# Patient Record
Sex: Male | Born: 1970
Health system: Southern US, Community
[De-identification: ages and names within clinical notes are randomized; demographics above are authoritative.]

## PROBLEM LIST (undated history)

## (undated) DIAGNOSIS — E663 Overweight: Secondary | ICD-10-CM

## (undated) DIAGNOSIS — R5382 Chronic fatigue, unspecified: Secondary | ICD-10-CM

## (undated) DIAGNOSIS — Z8669 Personal history of other diseases of the nervous system and sense organs: Secondary | ICD-10-CM

## (undated) DIAGNOSIS — Z87442 Personal history of urinary calculi: Secondary | ICD-10-CM

## (undated) DIAGNOSIS — M51369 Other intervertebral disc degeneration, lumbar region without mention of lumbar back pain or lower extremity pain: Secondary | ICD-10-CM

## (undated) DIAGNOSIS — Z8619 Personal history of other infectious and parasitic diseases: Secondary | ICD-10-CM

## (undated) DIAGNOSIS — Z8249 Family history of ischemic heart disease and other diseases of the circulatory system: Secondary | ICD-10-CM

## (undated) DIAGNOSIS — G8929 Other chronic pain: Secondary | ICD-10-CM

## (undated) DIAGNOSIS — E291 Testicular hypofunction: Secondary | ICD-10-CM

## (undated) DIAGNOSIS — I10 Essential (primary) hypertension: Secondary | ICD-10-CM

## (undated) DIAGNOSIS — E785 Hyperlipidemia, unspecified: Secondary | ICD-10-CM

## (undated) DIAGNOSIS — M5136 Other intervertebral disc degeneration, lumbar region: Secondary | ICD-10-CM

## (undated) HISTORY — DX: Overweight: E66.3

## (undated) HISTORY — DX: Personal history of other diseases of the nervous system and sense organs: Z86.69

## (undated) HISTORY — DX: Personal history of urinary calculi: Z87.442

## (undated) HISTORY — DX: Other intervertebral disc degeneration, lumbar region without mention of lumbar back pain or lower extremity pain: M51.369

## (undated) HISTORY — DX: Family history of ischemic heart disease and other diseases of the circulatory system: Z82.49

## (undated) HISTORY — DX: Essential (primary) hypertension: I10

## (undated) HISTORY — DX: Chronic fatigue, unspecified: R53.82

## (undated) HISTORY — DX: Other chronic pain: G89.29

## (undated) HISTORY — DX: Personal history of other infectious and parasitic diseases: Z86.19

## (undated) HISTORY — DX: Hyperlipidemia, unspecified: E78.5

## (undated) HISTORY — DX: Other intervertebral disc degeneration, lumbar region: M51.36

## (undated) HISTORY — DX: Testicular hypofunction: E29.1

---

## 1988-02-07 HISTORY — PX: ELBOW SURGERY: SHX618

## 1991-02-07 HISTORY — PX: SHOULDER SURGERY: SHX246

## 2001-07-18 ENCOUNTER — Ambulatory Visit (HOSPITAL_BASED_OUTPATIENT_CLINIC_OR_DEPARTMENT_OTHER): Admission: RE | Admit: 2001-07-18 | Discharge: 2001-07-18 | Payer: Self-pay | Admitting: Orthopedic Surgery

## 2002-02-06 HISTORY — PX: ELBOW SURGERY: SHX618

## 2002-11-26 ENCOUNTER — Ambulatory Visit (HOSPITAL_BASED_OUTPATIENT_CLINIC_OR_DEPARTMENT_OTHER): Admission: RE | Admit: 2002-11-26 | Discharge: 2002-11-26 | Payer: Self-pay | Admitting: Orthopedic Surgery

## 2006-01-24 ENCOUNTER — Ambulatory Visit (HOSPITAL_COMMUNITY): Admission: RE | Admit: 2006-01-24 | Discharge: 2006-01-24 | Payer: Self-pay | Admitting: Anesthesiology

## 2008-04-03 IMAGING — CR DG LUMBAR SPINE COMPLETE 4+V
6 series · 6 of 6 positions shown · non-contrast
Comparison: none

CLINICAL DATA: Diffuse spinal pain. No injury
 CERVICAL SPINE - 5 VIEW:

[t l-spine a.p.]
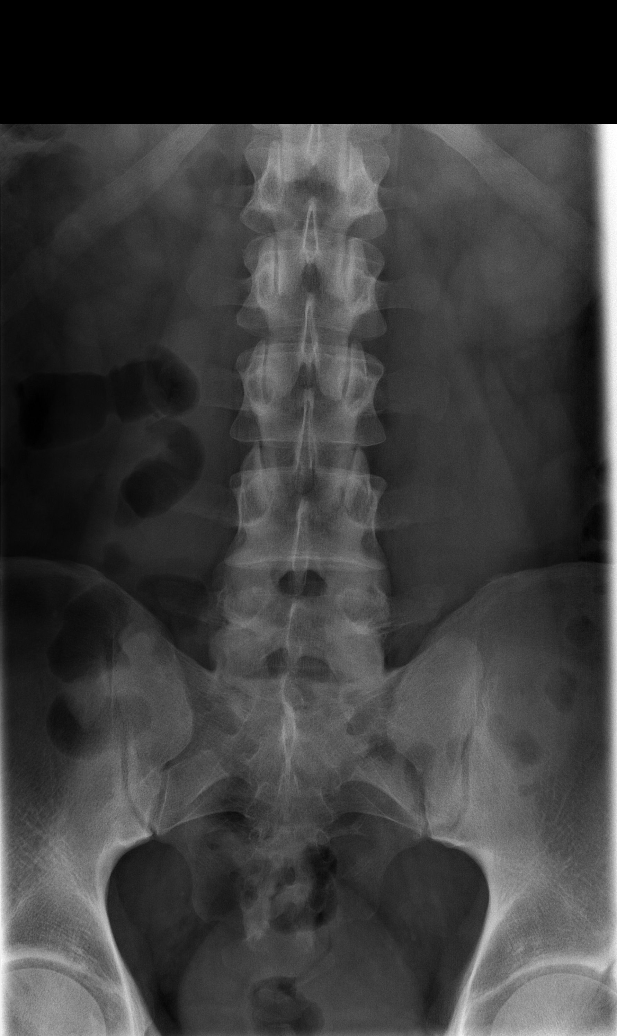

[t l-spine oblique exposure (1 of 3)]
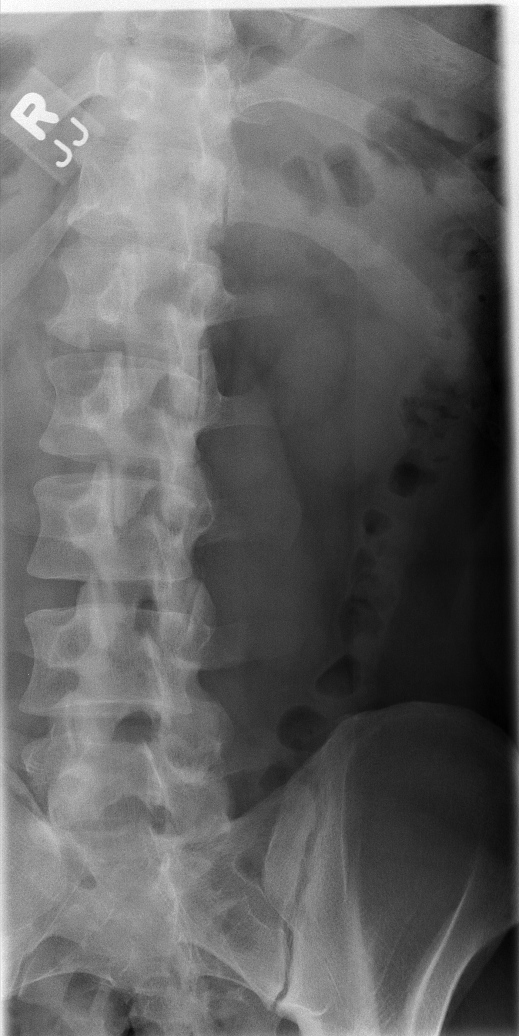

[t l-spine oblique exposure (2 of 3)]
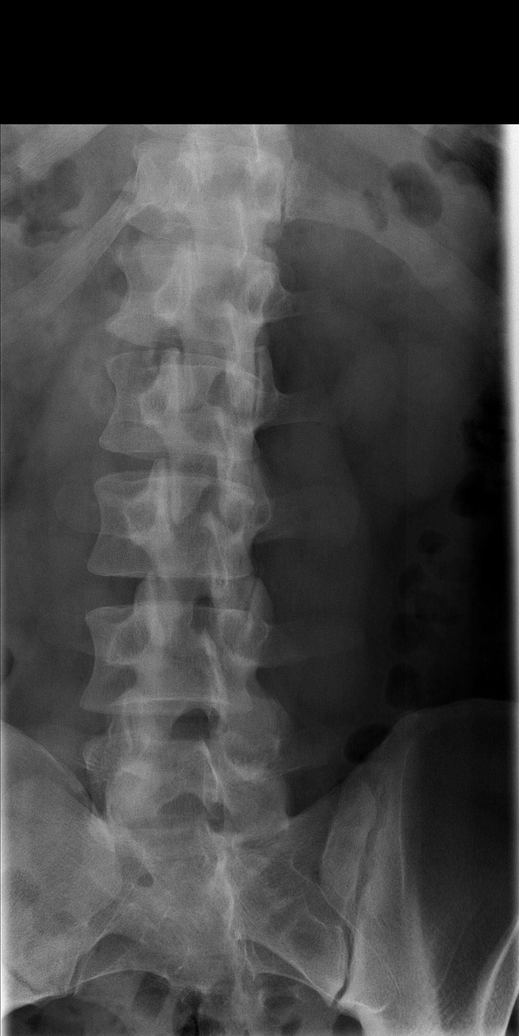

[t l-spine oblique exposure (3 of 3)]
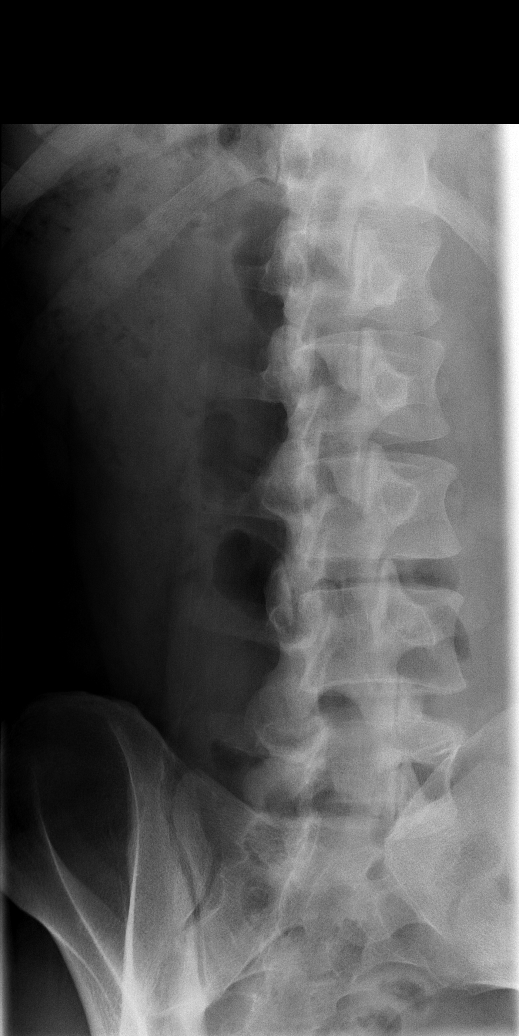

[t l-spine lat]
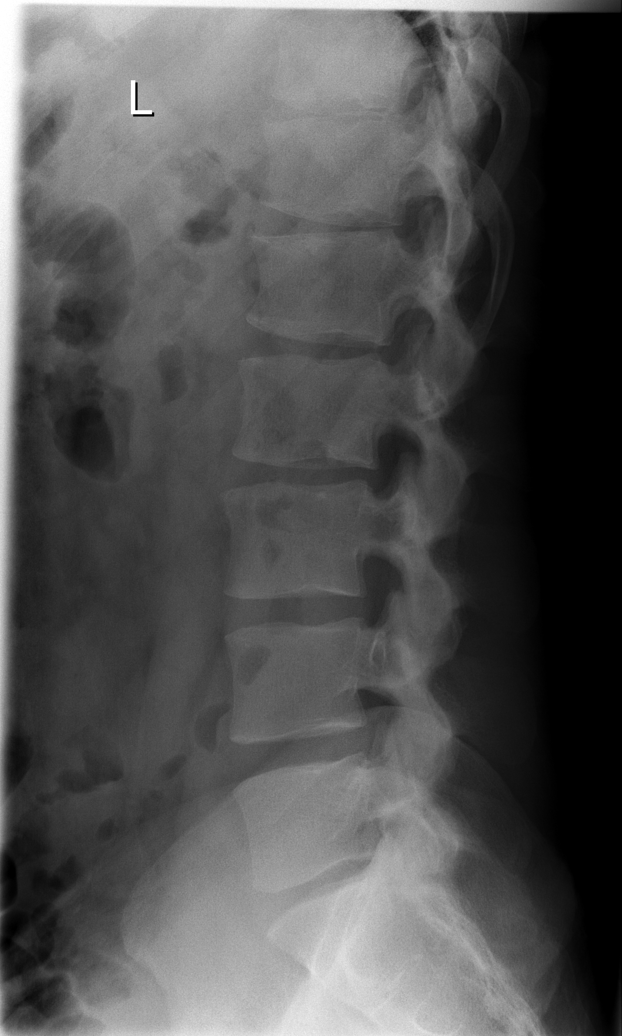

[t l-spine l5-s1 spot]
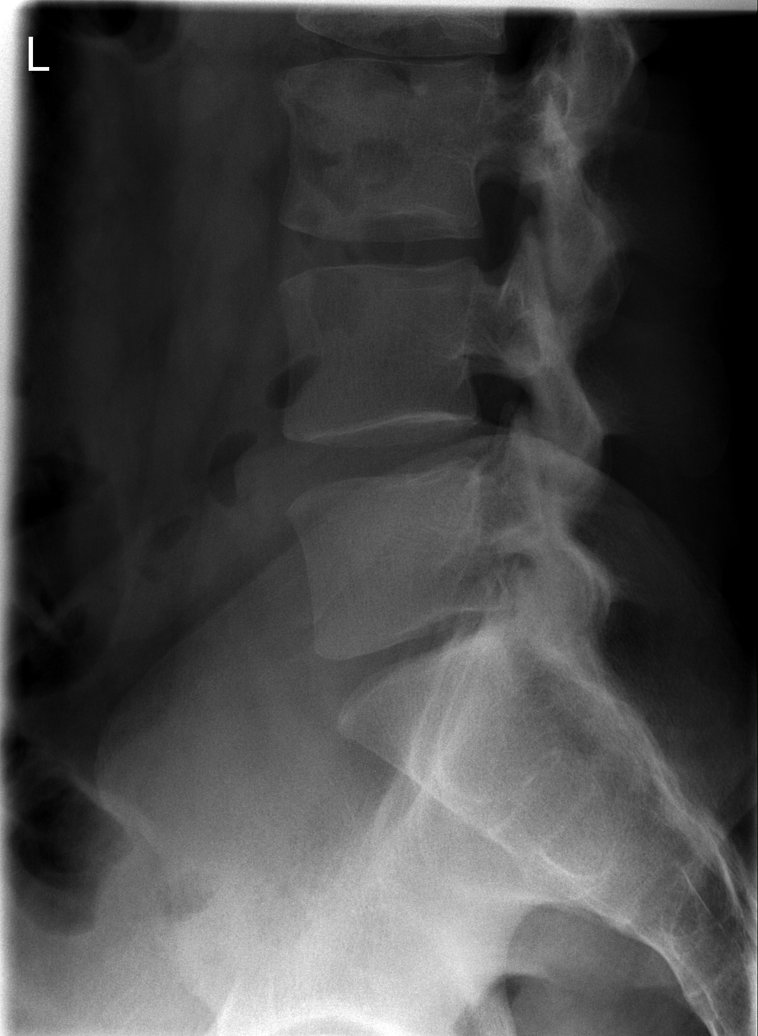

[6 of 6 positions shown; findings below may reference images not displayed]

FINDINGS: There is no evidence of cervical spine fracture or prevertebral soft tissue swelling.  Alignment is normal.  No other significant bone abnormalities are identified.
IMPRESSION: Negative cervical spine radiographs.
 THORACIC SPINE - 3 VIEW:
FINDINGS: There is no evidence of thoracic spine fracture.  Alignment is normal.  No other significant bone abnormalities are identified.
IMPRESSION: Negative thoracic spine radiographs.
 LUMBAR SPINE SERIES ? 5 VIEW:
FINDINGS: There are five lumbar type vertebrae present.  There is questionable bony spinal stenosis noted within the mid to lower lumbar spine. There are no fractures, subluxations, or destructive changes.  The interspaces are adequately maintained.
IMPRESSION: Possible bony spinal stenosis. Otherwise, negative study.

## 2011-08-03 ENCOUNTER — Encounter: Payer: Self-pay | Admitting: Family Medicine

## 2011-08-03 ENCOUNTER — Ambulatory Visit (INDEPENDENT_AMBULATORY_CARE_PROVIDER_SITE_OTHER): Payer: Federal, State, Local not specified - PPO | Admitting: Family Medicine

## 2011-08-03 VITALS — BP 154/110 | HR 95 | Temp 98.4°F | Ht 73.0 in | Wt 229.5 lb

## 2011-08-03 DIAGNOSIS — E663 Overweight: Secondary | ICD-10-CM

## 2011-08-03 DIAGNOSIS — E669 Obesity, unspecified: Secondary | ICD-10-CM | POA: Insufficient documentation

## 2011-08-03 DIAGNOSIS — M5136 Other intervertebral disc degeneration, lumbar region: Secondary | ICD-10-CM | POA: Insufficient documentation

## 2011-08-03 DIAGNOSIS — E66811 Obesity, class 1: Secondary | ICD-10-CM | POA: Insufficient documentation

## 2011-08-03 DIAGNOSIS — G894 Chronic pain syndrome: Secondary | ICD-10-CM | POA: Insufficient documentation

## 2011-08-03 DIAGNOSIS — I10 Essential (primary) hypertension: Secondary | ICD-10-CM

## 2011-08-03 DIAGNOSIS — G8929 Other chronic pain: Secondary | ICD-10-CM

## 2011-08-03 LAB — LIPID PANEL: Triglycerides: 240 mg/dL — ABNORMAL HIGH (ref 0.0–149.0)

## 2011-08-03 LAB — COMPREHENSIVE METABOLIC PANEL
ALT: 35 U/L (ref 0–53)
Albumin: 4.3 g/dL (ref 3.5–5.2)
Alkaline Phosphatase: 63 U/L (ref 39–117)
BUN: 11 mg/dL (ref 6–23)
Calcium: 9.3 mg/dL (ref 8.4–10.5)
Creatinine, Ser: 1 mg/dL (ref 0.4–1.5)
Glucose, Bld: 101 mg/dL — ABNORMAL HIGH (ref 70–99)
Sodium: 140 mEq/L (ref 135–145)
Total Protein: 7.5 g/dL (ref 6.0–8.3)

## 2011-08-03 MED ORDER — HYDROCHLOROTHIAZIDE 12.5 MG PO CAPS: 12.5000 mg | ORAL_CAPSULE | Freq: Every day | ORAL | Status: DC

## 2011-08-03 MED ORDER — LISINOPRIL-HYDROCHLOROTHIAZIDE 10-12.5 MG PO TABS: 1.0000 | ORAL_TABLET | Freq: Every day | ORAL | Status: DC

## 2011-08-03 NOTE — Assessment & Plan Note (Addendum)
Hypertensive as evidenced by recent readings at last few doctor visits (here and pain clinic) Discussed dietary changes to affect blood pressure lowering. Encouraged increased exercise. EKG today as well as blood work. EKG - NSR rate 75, normal axis, intervals, no ST/T changes. Start lisinopril HCTZ 10/12.5mg  daily.  Discussed side effects and allergic rxn to monitor for. rtc 1 mo for f/u.

## 2011-08-03 NOTE — Progress Notes (Signed)
Subjective:    Patient ID: William Friedman, male    DOB: 23-Mar-1970, 41 y.o.   MRN: [id number]  HPI CC: new pt, establish  Prior lived [place], [place] for 6 yrs.  Recently moved back to states with wife.  HTN - elevated in past, esp last few readings at pain management.  fmhx HTN.  No HA, vision changes, CP/tightness, SOB, leg swelling.  Poor diet.  High salt diet.  Poor water intake.  15-20lb weight gain since moving to the states.  chronic pain with lumbar DDD - on narcotics by pain clinic.  Preventative: Last CPE 2-3 yrs ago.  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  Planning on restarting gym Diet: poor - not regular fruits/vegetables, not regular water  Medications and allergies reviewed and updated in chart.  Past histories reviewed and updated if relevant as below. There is no problem list on file for this patient.  Past Medical History  Diagnosis Date  . History of chicken pox   . Migraine   . History of kidney stones     remote  . HTN (hypertension)   . DDD (degenerative disc disease), lumbar     chronic pain from this on chronic narcotics  . Chronic pain     sees [facility])   Past Surgical History  Procedure Date  . 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 Shoulder surgery 1993    R  . Elbow surgery 2004    R   History  Substance Use Topics  . Smoking status: Never Smoker   . Smokeless tobacco: Never Used  . Alcohol Use: Yes     rarely   Family History  Problem Relation Age of Onset  . Hypertension Mother   . Hypertension Father   . Coronary artery disease Maternal Grandfather 30  . Heart disease Maternal Grandmother   . Cancer Mother     breast  . Cancer Sister     breast  . Cancer Cousin 67  . Diabetes Neg Hx   . Stroke Neg Hx    No Known Allergies Current Outpatient Prescriptions on File Prior to  Visit  Medication Sig Dispense Refill  . zolpidem (AMBIEN) 10 MG tablet Take 10 mg by mouth at bedtime as needed.        Review of Systems  Constitutional: Negative for fever, chills, activity change, appetite change, fatigue and unexpected weight change.  HENT: Negative for hearing loss and neck pain.   Eyes: Negative for visual disturbance.  Respiratory: Negative for cough, chest tightness, shortness of breath and wheezing.   Cardiovascular: Negative for chest pain, palpitations and leg swelling.  Gastrointestinal: Negative for nausea, vomiting, abdominal pain, diarrhea, constipation, blood in stool and abdominal distention.  Genitourinary: Negative for hematuria and difficulty urinating.  Musculoskeletal: Negative for myalgias and arthralgias.  Skin: Negative for rash.  Neurological: Negative for dizziness, seizures, syncope and headaches.  Hematological: Does not bruise/bleed easily.  Psychiatric/Behavioral: Negative for dysphoric mood. The patient is not nervous/anxious.        Objective:   Physical Exam  Nursing note and vitals reviewed. Constitutional: He is oriented to person, place, and time. He appears well-developed and well-nourished. No distress.       Body mass index is 30.28 kg/(m^2).   HENT:  Head: Normocephalic and atraumatic.  Right Ear: Hearing, tympanic membrane,  external ear and ear canal normal.  Left Ear: Hearing, tympanic membrane, external ear and ear canal normal.  Nose: Nose normal. No mucosal edema or rhinorrhea.  Mouth/Throat: Oropharynx is clear and moist. No oropharyngeal exudate.  Eyes: Conjunctivae and EOM are normal. Pupils are equal, round, and reactive to light. No scleral icterus.  Neck: Normal range of motion. Neck supple.  Cardiovascular: Normal rate, regular rhythm, normal heart sounds and intact distal pulses.   No murmur heard. Pulses:      Radial pulses are 2+ on the right side, and 2+ on the left side.  Pulmonary/Chest: Effort normal  and breath sounds normal. No respiratory distress. He has no wheezes. He has no rales.  Abdominal: Soft. Bowel sounds are normal. He exhibits no distension and no mass. There is no tenderness. There is no rebound and no guarding.  Musculoskeletal: Normal range of motion. He exhibits no edema.  Lymphadenopathy:    He has no cervical adenopathy.  Neurological: He is alert and oriented to person, place, and time.       CN grossly intact, station and gait intact  Skin: Skin is warm and dry. No rash noted.  Psychiatric: He has a normal mood and affect. His behavior is normal. Judgment and thought content normal.       Assessment & Plan:

## 2011-08-03 NOTE — Patient Instructions (Addendum)
Good to meet you today.  Call us with questions. Return in 1 month for recheck blood pressure. Start lisinopril/hydrochlorothiazide 12.5mg  daily.  More fruits/vegetables for higher potassium.  Less salt/sodium (try to limit to 2000mg /day) Potassium diet handout provided today. EKG today - looking good. Blood work today.

## 2011-08-17 ENCOUNTER — Ambulatory Visit (INDEPENDENT_AMBULATORY_CARE_PROVIDER_SITE_OTHER): Payer: Federal, State, Local not specified - PPO | Admitting: Family Medicine

## 2011-08-17 ENCOUNTER — Other Ambulatory Visit (INDEPENDENT_AMBULATORY_CARE_PROVIDER_SITE_OTHER): Payer: Federal, State, Local not specified - PPO

## 2011-08-17 ENCOUNTER — Encounter: Payer: Self-pay | Admitting: Family Medicine

## 2011-08-17 VITALS — BP 148/97 | HR 98 | Temp 98.3°F | Ht 73.0 in | Wt 219.8 lb

## 2011-08-17 DIAGNOSIS — I10 Essential (primary) hypertension: Secondary | ICD-10-CM

## 2011-08-17 DIAGNOSIS — N179 Acute kidney failure, unspecified: Secondary | ICD-10-CM | POA: Insufficient documentation

## 2011-08-17 LAB — BASIC METABOLIC PANEL
BUN: 36 mg/dL — ABNORMAL HIGH (ref 6–23)
CO2: 26 mEq/L (ref 19–32)
Calcium: 10 mg/dL (ref 8.4–10.5)
Creatinine, Ser: 2.2 mg/dL — ABNORMAL HIGH (ref 0.4–1.5)
GFR: 34.22 mL/min — ABNORMAL LOW (ref >60.00)
Potassium: 3.9 mEq/L (ref 3.5–5.1)
Sodium: 135 mEq/L (ref 135–145)

## 2011-08-17 MED ORDER — AMLODIPINE BESYLATE 5 MG PO TABS: 5.0000 mg | ORAL_TABLET | Freq: Every day | ORAL | Status: DC

## 2011-08-17 MED ORDER — PROMETHAZINE HCL 25 MG PO TABS: 25.0000 mg | ORAL_TABLET | Freq: Three times a day (TID) | ORAL | Status: DC | PRN

## 2011-08-17 NOTE — Progress Notes (Signed)
  Subjective:    Patient ID: William Friedman, male    DOB: 1970/10/02, 41 y.o.   MRN: [id number]  HPI CC: not feeling well.  Saturday cut grass, got bit by yellow jackets.  Sunday went to play golf, only able to get through 6 holes and went home.  Since then, in bed.  Nauseated, deceased appetite, dizzy.  Hasn't eaten in 2 days.  Stopped BP med 3 days ago.    This in setting of recently starting lisinopril/hctz 10/12.5mg  for stage 2 HTN.  Has not been staying well hydrated.  Denies any NSAID use or other supplement use.  Review of Systems Per HPI    Objective:   Physical Exam  Nursing note and vitals reviewed. Constitutional: He appears well-developed and well-nourished. No distress.  HENT:  Head: Normocephalic and atraumatic.  Mouth/Throat: Oropharynx is clear and moist. No oropharyngeal exudate.  Eyes: Conjunctivae and EOM are normal. Pupils are equal, round, and reactive to light. No scleral icterus.  Cardiovascular: Normal rate, regular rhythm, normal heart sounds and intact distal pulses.   No murmur heard. Pulmonary/Chest: Effort normal and breath sounds normal. No respiratory distress. He has no wheezes. He has no rales.  Abdominal: Soft. Bowel sounds are normal. He exhibits no distension and no mass. There is no tenderness. There is no rebound and no guarding.       No abd/renal bruits  Musculoskeletal: He exhibits no edema.  Skin: Skin is warm and dry. No rash noted.       Assessment & Plan:

## 2011-08-17 NOTE — Assessment & Plan Note (Signed)
In setting of recent outdoor activity - likely some dehydration - as well as recently started on ACEI/HCTZ. Anticipate prerenal azotemia. Discussed in detail with patient importance to take plenty of fluid over next several days and hydrate kidneys. May recommend renal doppler to eval RAS given significant jump after ACEI. Return tomorrow for recheck Cr.  If not trending improved, low threshold to send to ER for IVF. Phenergan for nausea prn.

## 2011-08-17 NOTE — Patient Instructions (Addendum)
Your kidney function has deteriorated - likely from combination of blood pressure medicine and dehydration from working outside this week. zofran under the tongue today. Use phenergan for nausea at home - may make you sleepy Most important thing is to get plenty of fluid over the next several days. - water and gatorade. Return tomorrow for recheck kidney function. Start blood pressure medicine called amlodipine at 5mg  daily.

## 2011-08-17 NOTE — Assessment & Plan Note (Signed)
Stopped combo pill, however will likely need continued blood pressure control.  Start amlodipine 5mg  daily. Lab Results  Component Value Date   TSH 1.08 08/03/2011

## 2011-08-18 ENCOUNTER — Other Ambulatory Visit (INDEPENDENT_AMBULATORY_CARE_PROVIDER_SITE_OTHER): Payer: Federal, State, Local not specified - PPO

## 2011-08-18 ENCOUNTER — Other Ambulatory Visit: Payer: Self-pay | Admitting: Family Medicine

## 2011-08-18 DIAGNOSIS — N179 Acute kidney failure, unspecified: Secondary | ICD-10-CM

## 2011-08-18 LAB — RENAL FUNCTION PANEL
Albumin: 4.6 g/dL (ref 3.5–5.2)
BUN: 27 mg/dL — ABNORMAL HIGH (ref 6–23)
Calcium: 9.2 mg/dL (ref 8.4–10.5)

## 2011-08-24 ENCOUNTER — Other Ambulatory Visit (INDEPENDENT_AMBULATORY_CARE_PROVIDER_SITE_OTHER): Payer: Federal, State, Local not specified - PPO

## 2011-08-24 DIAGNOSIS — N179 Acute kidney failure, unspecified: Secondary | ICD-10-CM

## 2011-08-24 LAB — BASIC METABOLIC PANEL WITH GFR
BUN: 13 mg/dL (ref 6–23)
CO2: 25 meq/L (ref 19–32)
Calcium: 9.1 mg/dL (ref 8.4–10.5)
Chloride: 104 meq/L (ref 96–112)
Creatinine, Ser: 1.3 mg/dL (ref 0.4–1.5)
GFR: 62.99 mL/min
Glucose, Bld: 108 mg/dL — ABNORMAL HIGH (ref 70–99)
Potassium: 3.8 meq/L (ref 3.5–5.1)
Sodium: 139 meq/L (ref 135–145)

## 2011-09-01 ENCOUNTER — Ambulatory Visit (INDEPENDENT_AMBULATORY_CARE_PROVIDER_SITE_OTHER): Payer: Federal, State, Local not specified - PPO | Admitting: Family Medicine

## 2011-09-01 ENCOUNTER — Encounter: Payer: Self-pay | Admitting: Family Medicine

## 2011-09-01 VITALS — BP 140/90 | HR 87 | Temp 97.9°F | Ht 73.0 in | Wt 230.0 lb

## 2011-09-01 DIAGNOSIS — N179 Acute kidney failure, unspecified: Secondary | ICD-10-CM

## 2011-09-01 DIAGNOSIS — I10 Essential (primary) hypertension: Secondary | ICD-10-CM

## 2011-09-01 MED ORDER — AMLODIPINE BESYLATE 10 MG PO TABS: 10.0000 mg | ORAL_TABLET | Freq: Every day | ORAL | Status: DC

## 2011-09-01 NOTE — Progress Notes (Signed)
  Subjective:    Patient ID: William Friedman, male    DOB: December 01, 1970, 41 y.o.   MRN: [id number]  HPI CC: f/u HTN  See prior notes for details.  Last few days 145/99-100.  Compliant with amlodipine 5mg  daily.  Tolerating amlodipine well.  No leg swelling.  No HA, vision changes, CP/tightness, SOB, leg swelling.  Review of Systems Per HPI    Objective:   Physical Exam     Assessment & Plan:

## 2011-09-01 NOTE — Assessment & Plan Note (Signed)
Has resolved . Discussed importance of hydration.

## 2011-09-01 NOTE — Assessment & Plan Note (Signed)
See prior notes for details.  Improved control. Tolerating amlodipine, but likely will need higher dose for good control. BP Readings from Last 3 Encounters:  09/01/11 140/90  08/17/11 148/97  08/03/11 154/110  given ARF with ACEI, will schedule renal dopplers to eval for RAS.

## 2011-09-01 NOTE — Patient Instructions (Addendum)
Keep an eye on blood pressure.  If staying above 140/90 over the next week, increase amlodipine to 10mg  daily.  (double up on pills until new prescription). Pass by Marion's office to schedule renal dopplers to check kidney arteries. Good to see you today, call us with questions. Return in 3-4 months for follow up, prior fasting for blood work.

## 2011-09-05 ENCOUNTER — Encounter (INDEPENDENT_AMBULATORY_CARE_PROVIDER_SITE_OTHER): Payer: Federal, State, Local not specified - PPO

## 2011-09-05 DIAGNOSIS — I10 Essential (primary) hypertension: Secondary | ICD-10-CM

## 2011-09-05 DIAGNOSIS — N179 Acute kidney failure, unspecified: Secondary | ICD-10-CM

## 2011-09-13 ENCOUNTER — Ambulatory Visit: Payer: Self-pay | Admitting: Internal Medicine

## 2011-12-18 ENCOUNTER — Other Ambulatory Visit: Payer: Self-pay | Admitting: Family Medicine

## 2011-12-18 DIAGNOSIS — I10 Essential (primary) hypertension: Secondary | ICD-10-CM

## 2011-12-18 DIAGNOSIS — E785 Hyperlipidemia, unspecified: Secondary | ICD-10-CM

## 2011-12-26 ENCOUNTER — Other Ambulatory Visit (INDEPENDENT_AMBULATORY_CARE_PROVIDER_SITE_OTHER): Payer: Federal, State, Local not specified - PPO

## 2011-12-26 DIAGNOSIS — E785 Hyperlipidemia, unspecified: Secondary | ICD-10-CM

## 2011-12-26 DIAGNOSIS — I10 Essential (primary) hypertension: Secondary | ICD-10-CM

## 2011-12-26 LAB — BASIC METABOLIC PANEL
CO2: 26 mEq/L (ref 19–32)
Calcium: 9.2 mg/dL (ref 8.4–10.5)
Glucose, Bld: 129 mg/dL — ABNORMAL HIGH (ref 70–99)

## 2011-12-26 LAB — LIPID PANEL
HDL: 40.2 mg/dL (ref >39.00)
Total CHOL/HDL Ratio: 9

## 2012-01-02 ENCOUNTER — Ambulatory Visit: Payer: Federal, State, Local not specified - PPO | Admitting: Family Medicine

## 2012-01-12 ENCOUNTER — Ambulatory Visit (INDEPENDENT_AMBULATORY_CARE_PROVIDER_SITE_OTHER): Payer: Federal, State, Local not specified - PPO | Admitting: Family Medicine

## 2012-01-12 ENCOUNTER — Encounter: Payer: Self-pay | Admitting: *Deleted

## 2012-01-12 ENCOUNTER — Encounter: Payer: Self-pay | Admitting: Family Medicine

## 2012-01-12 VITALS — BP 128/90 | HR 96 | Temp 98.6°F | Ht 73.0 in | Wt 238.8 lb

## 2012-01-12 DIAGNOSIS — E663 Overweight: Secondary | ICD-10-CM

## 2012-01-12 DIAGNOSIS — R7303 Prediabetes: Secondary | ICD-10-CM | POA: Insufficient documentation

## 2012-01-12 DIAGNOSIS — R739 Hyperglycemia, unspecified: Secondary | ICD-10-CM

## 2012-01-12 DIAGNOSIS — Z23 Encounter for immunization: Secondary | ICD-10-CM

## 2012-01-12 DIAGNOSIS — E785 Hyperlipidemia, unspecified: Secondary | ICD-10-CM

## 2012-01-12 DIAGNOSIS — R7309 Other abnormal glucose: Secondary | ICD-10-CM

## 2012-01-12 DIAGNOSIS — I10 Essential (primary) hypertension: Secondary | ICD-10-CM

## 2012-01-12 LAB — HEMOGLOBIN A1C: Hgb A1c MFr Bld: 5.8 % (ref 4.6–6.5)

## 2012-01-12 MED ORDER — ATORVASTATIN CALCIUM 40 MG PO TABS: 40.0000 mg | ORAL_TABLET | Freq: Every day | ORAL | Status: DC

## 2012-01-12 MED ORDER — PROMETHAZINE HCL 25 MG PO TABS: 25.0000 mg | ORAL_TABLET | Freq: Three times a day (TID) | ORAL | Status: AC | PRN

## 2012-01-12 MED ORDER — HYDROCHLOROTHIAZIDE 12.5 MG PO TABS: 12.5000 mg | ORAL_TABLET | Freq: Every day | ORAL | Status: DC

## 2012-01-12 NOTE — Progress Notes (Signed)
  Subjective:    Patient ID: William Friedman, male    DOB: 09-07-1970, 41 y.o.   MRN: [id number]  HPI CC: f/u HTN  No questions or concerns today.  HTN - Compliant with norvasc 10mg  daily. H/o ARF with ACEI/HCTZ and dehydration. At home running 140-150/90-100.  Today better. No HA, vision changes, CP/tightness, SOB, leg swelling.  HLD - very high numbers, reviewed with patient.  Endorses poor dietary choices.  Father with CAD.  Hyperglycemia - glu fasting 129.  Father with DM.  Weight - new year's resolution to lose weight, wants to work out with coworkers.  Working more outside, walking with daughter outside.  Attributes weight gain to holiday season. Wt Readings from Last 3 Encounters:  01/12/12 238 lb 12 oz (108.296 kg)  09/01/11 230 lb (104.327 kg)  08/17/11 219 lb 12 oz (99.678 kg)  Body mass index is 31.50 kg/(m^2).   Past Medical History  Diagnosis Date  . History of chicken pox   . History of migraine   . History of kidney stones     remote  . HTN (hypertension)   . DDD (degenerative disc disease), lumbar     chronic pain from this on chronic narcotics  . Chronic pain     sees [facility])  . Overweight     Family History  Problem Relation Age of Onset  . Hypertension Mother   . Hypertension Father   . Coronary artery disease Maternal Grandfather 30  . Heart disease Maternal Grandmother   . Cancer Mother     breast  . Cancer Sister     breast  . Cancer Cousin 78  . Diabetes Father   . Stroke Father 34    ministroke  . Coronary artery disease Father 27    CABG    Review of Systems Per HPI    Objective:   Physical Exam  Nursing note and vitals reviewed. Constitutional: He appears well-developed and well-nourished. No distress.  HENT:  Head: Normocephalic and atraumatic.  Mouth/Throat: Oropharynx is clear and moist. No oropharyngeal exudate.  Eyes: Conjunctivae normal and EOM are normal. Pupils are equal, round, and  reactive to light. No scleral icterus.  Neck: Normal range of motion. Neck supple.  Cardiovascular: Normal rate, regular rhythm, normal heart sounds and intact distal pulses.   No murmur heard. Pulmonary/Chest: Effort normal and breath sounds normal. No respiratory distress. He has no wheezes. He has no rales.  Musculoskeletal: He exhibits no edema.  Lymphadenopathy:    He has no cervical adenopathy.  Skin: Skin is warm and dry. No rash noted.  Psychiatric: He has a normal mood and affect.       Assessment & Plan:

## 2012-01-12 NOTE — Assessment & Plan Note (Signed)
Chronic, not at goal.  Given endorsed high readings at home, start HCTZ 12.5mg  daily.

## 2012-01-12 NOTE — Assessment & Plan Note (Signed)
Remaining elevated - start atorvastatin. Discussed myalgias. Framingham risk = 11%.  rec start aspirin QOD.

## 2012-01-12 NOTE — Assessment & Plan Note (Signed)
Reviewed fasting cbg with patient and concern for being in diabetic range.  Check a1c today. If elevated >6.5%, start metformin and rtc 1 mo for f/u.  O/w rtc 3 mo.

## 2012-01-12 NOTE — Patient Instructions (Addendum)
Return in 3 months for follow up - if sugar is very high I may have you come back in one month instead  Start lipitor 40mg  daily (atorvastatin is generic) for cholesterol. Start hctz 12.5mg  daily for blood pressure. Checking A1c today - we will call you with results and whether we need to start diabetes medicine. Flu shot today Start enteric coated baby aspirin every other day.

## 2012-01-12 NOTE — Assessment & Plan Note (Signed)
Reviewed weight today.

## 2012-02-07 DIAGNOSIS — I10 Essential (primary) hypertension: Secondary | ICD-10-CM

## 2012-02-07 HISTORY — DX: Essential (primary) hypertension: I10

## 2012-04-12 ENCOUNTER — Ambulatory Visit: Payer: Federal, State, Local not specified - PPO | Admitting: Family Medicine

## 2012-04-19 ENCOUNTER — Ambulatory Visit (INDEPENDENT_AMBULATORY_CARE_PROVIDER_SITE_OTHER): Payer: Federal, State, Local not specified - PPO | Admitting: Family Medicine

## 2012-04-19 ENCOUNTER — Encounter: Payer: Self-pay | Admitting: Family Medicine

## 2012-04-19 VITALS — BP 126/78 | HR 72 | Temp 97.5°F

## 2012-04-19 DIAGNOSIS — R7309 Other abnormal glucose: Secondary | ICD-10-CM

## 2012-04-19 DIAGNOSIS — I10 Essential (primary) hypertension: Secondary | ICD-10-CM

## 2012-04-19 DIAGNOSIS — E291 Testicular hypofunction: Secondary | ICD-10-CM | POA: Insufficient documentation

## 2012-04-19 DIAGNOSIS — R5383 Other fatigue: Secondary | ICD-10-CM

## 2012-04-19 DIAGNOSIS — E785 Hyperlipidemia, unspecified: Secondary | ICD-10-CM

## 2012-04-19 DIAGNOSIS — E663 Overweight: Secondary | ICD-10-CM

## 2012-04-19 DIAGNOSIS — R5381 Other malaise: Secondary | ICD-10-CM

## 2012-04-19 DIAGNOSIS — R739 Hyperglycemia, unspecified: Secondary | ICD-10-CM

## 2012-04-19 MED ORDER — HYDROCHLOROTHIAZIDE 12.5 MG PO TABS: 12.5000 mg | ORAL_TABLET | Freq: Every day | ORAL | Status: DC

## 2012-04-19 MED ORDER — PROMETHAZINE HCL 25 MG PO TABS: 25.0000 mg | ORAL_TABLET | Freq: Four times a day (QID) | ORAL | Status: DC | PRN

## 2012-04-19 MED ORDER — AMLODIPINE BESYLATE 10 MG PO TABS: 10.0000 mg | ORAL_TABLET | Freq: Every day | ORAL | Status: DC

## 2012-04-19 MED ORDER — ATORVASTATIN CALCIUM 40 MG PO TABS: 40.0000 mg | ORAL_TABLET | Freq: Every day | ORAL | Status: DC

## 2012-04-19 NOTE — Assessment & Plan Note (Signed)
Chronic, stable. Continue meds. rtc 6 mo for f/u. BP Readings from Last 3 Encounters:  04/19/12 126/78  01/12/12 128/90  09/01/11 140/90

## 2012-04-19 NOTE — Assessment & Plan Note (Signed)
Will continue to monitor.

## 2012-04-19 NOTE — Assessment & Plan Note (Signed)
Encouraged to start exercising regimen.

## 2012-04-19 NOTE — Assessment & Plan Note (Signed)
Encouraged to change diet - more nuts, beans, lentils and legumes and soy.  More fruits/vegetables/fiber. Continue lipitor 40mg . Check FLP next blood work (6 mo)

## 2012-04-19 NOTE — Patient Instructions (Signed)
You're doing well. Return in 6 months fasting for blood work, afterwards for follow up visit. Work on diet and increase regular activity

## 2012-04-19 NOTE — Progress Notes (Signed)
  Subjective:    Patient ID: William Friedman, male    DOB: Dec 05, 1970, 42 y.o.   MRN: [id number]  HPI CC: 3 mo f/u  [name] returns today as 3 month follow up for hypertension, hyperlipidemia and hyperglycemia.  HTN - No HA, vision changes, CP/tightness, SOB, leg swelling. Compliant with bp meds.  Not checking at home.  States at pain clinic sbp has been elevated - 150s HLD - compliant with lipitor 40mg .  No mylagias. Obesity - has not restarted exercise regimen yet - lacks motivation. Hyperglycemia - A1c elevated at 5.8% - prediabetes. Overall stable.  Wonders about low testosterone.  Denies depressed mood.  Endorses decreased energy level.  Average sex drive.  Wt Readings from Last 3 Encounters:  01/12/12 238 lb 12 oz (108.296 kg)  09/01/11 230 lb (104.327 kg)  08/17/11 219 lb 12 oz (99.678 kg)  scale is broken.  Past Medical History  Diagnosis Date  . History of chicken pox   . History of migraine   . History of kidney stones     remote  . HTN (hypertension)   . DDD (degenerative disc disease), lumbar     chronic pain from this on chronic narcotics  . Chronic pain     sees [facility])  . Overweight   . Hyperlipidemia      Review of Systems Per HPI    Objective:   Physical Exam  Nursing note and vitals reviewed. Constitutional: He appears well-developed and well-nourished. No distress.  HENT:  Head: Normocephalic and atraumatic.  Mouth/Throat: Oropharynx is clear and moist. No oropharyngeal exudate.  Eyes: Conjunctivae and EOM are normal. Pupils are equal, round, and reactive to light. No scleral icterus.  Neck: Normal range of motion. Neck supple. Carotid bruit is not present.  Cardiovascular: Normal rate, regular rhythm, normal heart sounds and intact distal pulses.   No murmur heard. Pulmonary/Chest: Effort normal and breath sounds normal. No respiratory distress. He has no wheezes. He has no rales.  Musculoskeletal: He exhibits no  edema.       Assessment & Plan:

## 2012-04-19 NOTE — Assessment & Plan Note (Signed)
Consider checking T next blood draw.

## 2012-07-31 ENCOUNTER — Other Ambulatory Visit: Payer: Self-pay | Admitting: Family Medicine

## 2012-10-15 ENCOUNTER — Other Ambulatory Visit (INDEPENDENT_AMBULATORY_CARE_PROVIDER_SITE_OTHER): Payer: Federal, State, Local not specified - PPO

## 2012-10-15 ENCOUNTER — Other Ambulatory Visit: Payer: Self-pay | Admitting: Family Medicine

## 2012-10-15 DIAGNOSIS — R7309 Other abnormal glucose: Secondary | ICD-10-CM

## 2012-10-15 DIAGNOSIS — E785 Hyperlipidemia, unspecified: Secondary | ICD-10-CM

## 2012-10-15 DIAGNOSIS — E663 Overweight: Secondary | ICD-10-CM

## 2012-10-15 DIAGNOSIS — R7303 Prediabetes: Secondary | ICD-10-CM

## 2012-10-15 DIAGNOSIS — R5383 Other fatigue: Secondary | ICD-10-CM

## 2012-10-15 DIAGNOSIS — I10 Essential (primary) hypertension: Secondary | ICD-10-CM

## 2012-10-15 DIAGNOSIS — R5381 Other malaise: Secondary | ICD-10-CM

## 2012-10-15 LAB — CBC WITH DIFFERENTIAL/PLATELET
Eosinophils Relative: 2.1 % (ref 0.0–5.0)
MCV: 85.9 fl (ref 78.0–100.0)
Monocytes Absolute: 0.6 10*3/uL (ref 0.1–1.0)
Monocytes Relative: 8.7 % (ref 3.0–12.0)
Neutrophils Relative %: 53.4 % (ref 43.0–77.0)
Platelets: 367 10*3/uL (ref 150.0–400.0)
WBC: 7.2 10*3/uL (ref 4.5–10.5)

## 2012-10-15 LAB — LIPID PANEL: Cholesterol: 224 mg/dL — ABNORMAL HIGH (ref 0–200)

## 2012-10-15 LAB — COMPREHENSIVE METABOLIC PANEL
BUN: 13 mg/dL (ref 6–23)
CO2: 26 mEq/L (ref 19–32)
Calcium: 9.4 mg/dL (ref 8.4–10.5)
Chloride: 103 mEq/L (ref 96–112)
Creatinine, Ser: 1 mg/dL (ref 0.4–1.5)
GFR: 82.5 mL/min (ref >60.00)

## 2012-10-15 LAB — LDL CHOLESTEROL, DIRECT: Direct LDL: 128.3 mg/dL

## 2012-10-15 LAB — VITAMIN B12: Vitamin B-12: 529 pg/mL (ref 211–911)

## 2012-10-15 LAB — TESTOSTERONE: Testosterone: 178.85 ng/dL — ABNORMAL LOW (ref 350.00–890.00)

## 2012-10-15 LAB — HEMOGLOBIN A1C: Hgb A1c MFr Bld: 5.9 % (ref 4.6–6.5)

## 2012-10-22 ENCOUNTER — Ambulatory Visit (INDEPENDENT_AMBULATORY_CARE_PROVIDER_SITE_OTHER): Payer: Federal, State, Local not specified - PPO | Admitting: Family Medicine

## 2012-10-22 ENCOUNTER — Encounter: Payer: Self-pay | Admitting: Family Medicine

## 2012-10-22 VITALS — BP 118/88 | HR 88 | Temp 97.8°F | Ht 73.0 in | Wt 246.0 lb

## 2012-10-22 DIAGNOSIS — I10 Essential (primary) hypertension: Secondary | ICD-10-CM

## 2012-10-22 DIAGNOSIS — R7303 Prediabetes: Secondary | ICD-10-CM

## 2012-10-22 DIAGNOSIS — E785 Hyperlipidemia, unspecified: Secondary | ICD-10-CM

## 2012-10-22 DIAGNOSIS — Z23 Encounter for immunization: Secondary | ICD-10-CM

## 2012-10-22 DIAGNOSIS — E291 Testicular hypofunction: Secondary | ICD-10-CM

## 2012-10-22 DIAGNOSIS — R7309 Other abnormal glucose: Secondary | ICD-10-CM

## 2012-10-22 DIAGNOSIS — R7989 Other specified abnormal findings of blood chemistry: Secondary | ICD-10-CM

## 2012-10-22 DIAGNOSIS — E663 Overweight: Secondary | ICD-10-CM

## 2012-10-22 LAB — PSA: PSA: 0.56 ng/mL (ref 0.10–4.00)

## 2012-10-22 NOTE — Assessment & Plan Note (Signed)
Again reviewed #s - pt did have mc donalds meal prior to last bood work - recheck trig today. Change lipitor to 40mg  qhs.

## 2012-10-22 NOTE — Assessment & Plan Note (Signed)
Chronic, stable on current regimen - continue. 

## 2012-10-22 NOTE — Progress Notes (Signed)
  Subjective:    Patient ID: William Friedman, male    DOB: [birth year], 42 y.o.   MRN: [id number]  HPI CC: 6 mo f/u  William Friedman returns today as 3 month follow up for hypertension, hyperlipidemia and hyperglycemia.   HTN - No HA, vision changes, CP/tightness, SOB, leg swelling. Compliant with bp meds.  HLD - compliant with lipitor 40mg . No mylagias. Takes at night. Prediabetes - A1c elevated at 5.8% - prediabetes. Overall stable.  Fatigue - ongoing for several years now.  Denies depressed mood or decreased libido.  Found to have low testosterone.  Would be interested in supplementation if confirmatory testing low.  Obesity - hasn't changed diet.  Exercising some - walking, golfing.  Nothing regular.  Weight gain noted. Wt Readings from Last 3 Encounters:  10/22/12 246 lb (111.585 kg)  01/12/12 238 lb 12 oz (108.296 kg)  09/01/11 230 lb (104.327 kg)   Past Medical History  Diagnosis Date  . History of chicken pox   . History of migraine   . History of kidney stones     remote  . HTN (hypertension)   . DDD (degenerative disc disease), lumbar     chronic pain from this on chronic narcotics  . Chronic pain     sees [facility])  . Overweight(278.02)   . Hyperlipidemia    Review of Systems Per HPI    Objective:   Physical Exam  Nursing note and vitals reviewed. Constitutional: He appears well-developed and well-nourished. No distress.  HENT:  Mouth/Throat: Oropharynx is clear and moist. No oropharyngeal exudate.  Cardiovascular: Normal rate, regular rhythm, normal heart sounds and intact distal pulses.   No murmur heard. Pulmonary/Chest: Effort normal and breath sounds normal. No respiratory distress. He has no wheezes. He has no rales.  Musculoskeletal: He exhibits no edema.       Assessment & Plan:

## 2012-10-22 NOTE — Assessment & Plan Note (Signed)
Again reviewed with patient

## 2012-10-22 NOTE — Assessment & Plan Note (Signed)
Body mass index is 32.46 kg/(m^2).  Encouraged weight loss through regular exercise regimen.

## 2012-10-22 NOTE — Assessment & Plan Note (Signed)
Low T on screening blood work - recheck today confirmatory testing. Discussed pros/cons of supplementation. If confirmed low - consider topical testosterone. Check PSA in anticipation today

## 2012-10-22 NOTE — Addendum Note (Signed)
Addended by: Janalyn Harder on: 10/22/2012 10:58 AM   Modules accepted: Orders

## 2012-10-22 NOTE — Patient Instructions (Addendum)
Flu shot today. Let's change lipitor to 40mg  at bedtime.   Blood work today to recheck testosterone and triglycerides. Work on decreased sweetened beverages. Good to see you today, call us with questions.

## 2012-10-23 ENCOUNTER — Other Ambulatory Visit: Payer: Self-pay | Admitting: Family Medicine

## 2012-10-23 DIAGNOSIS — E291 Testicular hypofunction: Secondary | ICD-10-CM

## 2012-10-23 LAB — TESTOSTERONE, FREE, TOTAL, SHBG
Testosterone-% Free: 2.2 % (ref 1.6–2.9)
Testosterone: 180 ng/dL — ABNORMAL LOW (ref 300–890)

## 2012-10-23 MED ORDER — TESTOSTERONE 20.25 MG/ACT (1.62%) TD GEL: 1.0000 | Freq: Every day | TRANSDERMAL | Status: DC

## 2012-10-23 NOTE — Telephone Encounter (Signed)
Spoke with patient. He said he forgot to ask for refill yesterday, but would like to keep some on hand because when he does get nauseated, it seems to be the only thing that works.

## 2012-10-23 NOTE — Telephone Encounter (Signed)
Ok to refill 

## 2012-10-23 NOTE — Telephone Encounter (Signed)
plz check with pt to see if persistent need and why

## 2012-10-24 ENCOUNTER — Ambulatory Visit: Payer: Federal, State, Local not specified - PPO

## 2012-10-24 DIAGNOSIS — E291 Testicular hypofunction: Secondary | ICD-10-CM

## 2012-10-25 ENCOUNTER — Telehealth: Payer: Self-pay

## 2012-10-25 NOTE — Telephone Encounter (Signed)
Prior auth required for Androgel; called to get form [id number] and Herbert Seta said could do on phone now with diagnosis. Approval over phone 08/24/12-04/24/13. Rob at [facility] said rx went thru.

## 2012-10-27 ENCOUNTER — Encounter: Payer: Self-pay | Admitting: Family Medicine

## 2012-11-22 ENCOUNTER — Other Ambulatory Visit: Payer: Federal, State, Local not specified - PPO

## 2012-11-22 ENCOUNTER — Other Ambulatory Visit (INDEPENDENT_AMBULATORY_CARE_PROVIDER_SITE_OTHER): Payer: Federal, State, Local not specified - PPO

## 2012-11-22 DIAGNOSIS — E291 Testicular hypofunction: Secondary | ICD-10-CM

## 2012-11-26 ENCOUNTER — Other Ambulatory Visit: Payer: Self-pay | Admitting: Family Medicine

## 2012-11-26 DIAGNOSIS — E291 Testicular hypofunction: Secondary | ICD-10-CM

## 2012-11-26 MED ORDER — TESTOSTERONE 20.25 MG/ACT (1.62%) TD GEL: 2.0000 | Freq: Every day | TRANSDERMAL | Status: DC

## 2012-12-06 ENCOUNTER — Other Ambulatory Visit: Payer: Self-pay | Admitting: Family Medicine

## 2012-12-06 NOTE — Telephone Encounter (Signed)
Ok to refill 

## 2013-01-24 ENCOUNTER — Other Ambulatory Visit (INDEPENDENT_AMBULATORY_CARE_PROVIDER_SITE_OTHER): Payer: Federal, State, Local not specified - PPO

## 2013-01-24 DIAGNOSIS — E291 Testicular hypofunction: Secondary | ICD-10-CM

## 2013-01-24 LAB — CORTISOL: Cortisol, Plasma: 20.6 ug/dL

## 2013-01-24 LAB — T4, FREE: Free T4: 0.95 ng/dL (ref 0.60–1.60)

## 2013-01-27 LAB — TESTOSTERONE, FREE, TOTAL, SHBG
Sex Hormone Binding: 27 nmol/L (ref 13–71)
Testosterone-% Free: 2.3 % (ref 1.6–2.9)
Testosterone: 498 ng/dL (ref 300–890)

## 2013-02-11 ENCOUNTER — Other Ambulatory Visit: Payer: Self-pay | Admitting: Family Medicine

## 2013-02-11 NOTE — Telephone Encounter (Signed)
Please refill times one  

## 2013-02-11 NOTE — Telephone Encounter (Signed)
Ok to refill in Dr. Timoteo ExposeG's absence? Patient likes to keep some on hand if needed. Last filled 12/06/12.

## 2013-02-12 NOTE — Telephone Encounter (Signed)
Rx refilled as directed. 

## 2013-04-21 ENCOUNTER — Ambulatory Visit: Payer: Federal, State, Local not specified - PPO | Admitting: Family Medicine

## 2013-04-24 ENCOUNTER — Other Ambulatory Visit: Payer: Self-pay | Admitting: Family Medicine

## 2013-04-25 NOTE — Telephone Encounter (Signed)
Rout to PCP 

## 2013-05-15 ENCOUNTER — Other Ambulatory Visit: Payer: Self-pay | Admitting: Family Medicine

## 2013-07-26 ENCOUNTER — Other Ambulatory Visit: Payer: Self-pay | Admitting: Family Medicine

## 2013-10-19 ENCOUNTER — Other Ambulatory Visit: Payer: Self-pay | Admitting: Family Medicine

## 2013-11-12 ENCOUNTER — Other Ambulatory Visit: Payer: Self-pay | Admitting: Family Medicine

## 2013-11-13 NOTE — Telephone Encounter (Signed)
Ok to refill 

## 2014-04-18 ENCOUNTER — Encounter (HOSPITAL_COMMUNITY): Payer: Self-pay

## 2014-04-18 ENCOUNTER — Emergency Department (HOSPITAL_COMMUNITY)
Admission: EM | Admit: 2014-04-18 | Discharge: 2014-04-18 | Disposition: A | Discharge status: Home / Self Care | Payer: Federal, State, Local not specified - PPO | Attending: Emergency Medicine | Admitting: Emergency Medicine

## 2014-04-18 DIAGNOSIS — Z87442 Personal history of urinary calculi: Secondary | ICD-10-CM | POA: Diagnosis not present

## 2014-04-18 DIAGNOSIS — G40909 Epilepsy, unspecified, not intractable, without status epilepticus: Secondary | ICD-10-CM | POA: Insufficient documentation

## 2014-04-18 DIAGNOSIS — Z7982 Long term (current) use of aspirin: Secondary | ICD-10-CM | POA: Insufficient documentation

## 2014-04-18 DIAGNOSIS — G8929 Other chronic pain: Secondary | ICD-10-CM | POA: Insufficient documentation

## 2014-04-18 DIAGNOSIS — F1119 Opioid abuse with unspecified opioid-induced disorder: Secondary | ICD-10-CM | POA: Diagnosis not present

## 2014-04-18 DIAGNOSIS — E785 Hyperlipidemia, unspecified: Secondary | ICD-10-CM | POA: Insufficient documentation

## 2014-04-18 DIAGNOSIS — Z79899 Other long term (current) drug therapy: Secondary | ICD-10-CM | POA: Diagnosis not present

## 2014-04-18 DIAGNOSIS — I10 Essential (primary) hypertension: Secondary | ICD-10-CM | POA: Diagnosis not present

## 2014-04-18 DIAGNOSIS — Z8619 Personal history of other infectious and parasitic diseases: Secondary | ICD-10-CM | POA: Diagnosis not present

## 2014-04-18 DIAGNOSIS — F1310 Sedative, hypnotic or anxiolytic abuse, uncomplicated: Secondary | ICD-10-CM | POA: Diagnosis not present

## 2014-04-18 DIAGNOSIS — IMO0002 Reserved for concepts with insufficient information to code with codable children: Secondary | ICD-10-CM

## 2014-04-18 DIAGNOSIS — T402X4A Poisoning by other opioids, undetermined, initial encounter: Secondary | ICD-10-CM | POA: Diagnosis present

## 2014-04-18 DIAGNOSIS — E663 Overweight: Secondary | ICD-10-CM | POA: Insufficient documentation

## 2014-04-18 LAB — CBC WITH DIFFERENTIAL/PLATELET
BASOS ABS: 0 10*3/uL (ref 0.0–0.1)
Basophils Relative: 0 % (ref 0–1)
EOS ABS: 0 10*3/uL (ref 0.0–0.7)
Eosinophils Relative: 1 % (ref 0–5)
HCT: 38.3 % — ABNORMAL LOW (ref 39.0–52.0)
HEMOGLOBIN: 12.9 g/dL — AB (ref 13.0–17.0)
Lymphocytes Relative: 24 % (ref 12–46)
Lymphs Abs: 2 10*3/uL (ref 0.7–4.0)
MCH: 28.6 pg (ref 26.0–34.0)
MCHC: 33.7 g/dL (ref 30.0–36.0)
MCV: 84.9 fL (ref 78.0–100.0)
MONO ABS: 0.5 10*3/uL (ref 0.1–1.0)
MONOS PCT: 6 % (ref 3–12)
NEUTROS ABS: 5.9 10*3/uL (ref 1.7–7.7)
NEUTROS PCT: 69 % (ref 43–77)
Platelets: 360 10*3/uL (ref 150–400)
RBC: 4.51 MIL/uL (ref 4.22–5.81)
RDW: 13.8 % (ref 11.5–15.5)
WBC: 8.4 10*3/uL (ref 4.0–10.5)

## 2014-04-18 LAB — COMPREHENSIVE METABOLIC PANEL
ALT: 31 U/L (ref 0–53)
AST: 25 U/L (ref 0–37)
Albumin: 4.9 g/dL (ref 3.5–5.2)
Alkaline Phosphatase: 89 U/L (ref 39–117)
Anion gap: 9 (ref 5–15)
BILIRUBIN TOTAL: 0.4 mg/dL (ref 0.3–1.2)
BUN: 9 mg/dL (ref 6–23)
CHLORIDE: 109 mmol/L (ref 96–112)
CO2: 23 mmol/L (ref 19–32)
CREATININE: 1.19 mg/dL (ref 0.50–1.35)
Calcium: 9.5 mg/dL (ref 8.4–10.5)
GFR calc Af Amer: 84 mL/min — ABNORMAL LOW (ref >90)
GFR calc non Af Amer: 72 mL/min — ABNORMAL LOW (ref >90)
Glucose, Bld: 112 mg/dL — ABNORMAL HIGH (ref 70–99)
POTASSIUM: 3.7 mmol/L (ref 3.5–5.1)
Sodium: 141 mmol/L (ref 135–145)
TOTAL PROTEIN: 8.2 g/dL (ref 6.0–8.3)

## 2014-04-18 LAB — RAPID URINE DRUG SCREEN, HOSP PERFORMED
Amphetamines: NOT DETECTED
BARBITURATES: NOT DETECTED
BENZODIAZEPINES: POSITIVE — AB
Cocaine: NOT DETECTED
Opiates: POSITIVE — AB
TETRAHYDROCANNABINOL: NOT DETECTED

## 2014-04-18 LAB — ACETAMINOPHEN LEVEL: Acetaminophen (Tylenol), Serum: <10 ug/mL — ABNORMAL LOW (ref 10–30)

## 2014-04-18 LAB — SALICYLATE LEVEL: Salicylate Lvl: <4 mg/dL (ref 2.8–20.0)

## 2014-04-18 LAB — ETHANOL: Alcohol, Ethyl (B): <5 mg/dL (ref 0–9)

## 2014-04-18 MED ORDER — SODIUM CHLORIDE 0.9 % IV SOLN
INTRAVENOUS | Status: DC
  Administered 2014-04-18: 01:00:00 via INTRAVENOUS

## 2014-04-18 NOTE — ED Notes (Signed)
Per pt's family, he has taken the entire bottle of ambien since it was filled on 3/8 although patient reports otherwise. They report he has also taken some of his mother's oxycodone over the last several days.

## 2014-04-18 NOTE — ED Notes (Signed)
75mcg Fentanyl patch removed from right arm; Dr Read DriversMolpus in to speak to patient; pt advised the MD that he is out of Percocet which was filled on 3/17; pt had Ambien filled on 3/8 10mg   30 tabs and the bottle is empty; pt also advised that he has been taking Hydrocodone that was prescribed to his mother because he was out of his Percocet.

## 2014-04-18 NOTE — ED Notes (Signed)
Per pt, he was not attempting to hurt himself. Pt reports that he has only taken 2 per night since having them filled on the 8th and he is unsure of where the rest of the pills are.

## 2014-04-18 NOTE — ED Notes (Signed)
Patient is alert and oriented x3.  He was given DC instructions and follow up visit instructions.  Patient gave verbal understanding.  He was DC ambulatory under his own power to home.  V/S stable.  He was not showing any signs of distress on DC 

## 2014-04-18 NOTE — ED Notes (Signed)
Pt presents via EMS from home with c/o drug overdose. Pt had his ambien filled on 04/14/14, 30 tablets, and the bottle is now empty. Per EMS, pt is disoriented and is not aware of what is going on. Unsure of whether he took all of the pills today or how many he took. Pt has a 20g left hand placed by EMS.

## 2014-04-18 NOTE — ED Notes (Signed)
Bed: RESA Expected date:  Expected time:  Means of arrival:  Comments: EMS 44yo M - bizarre behavior, ? Ambien OD

## 2014-04-18 NOTE — ED Provider Notes (Addendum)
CSN: [id number]     Arrival date & time 04/18/14  0010 History   First MD Initiated Contact with Patient 04/18/14 0015     Chief Complaint  Patient presents with  . Drug Overdose     (Consider location/radiation/quality/duration/timing/severity/associated sxs/prior Treatment) HPI  This is a 44 year old male with a history of chronic pain. He is currently on a pain management contract with Dr. Thyra Breed of [facility]. He receives fentanyl patches 75 picograms per hour, 10 monthly. He receives 180 Percocet 10/325 monthly. He receives 30 Ambien 10 milligram monthly. His Ambien was last filled on March 8 of this year. His fentanyl and Percocet were last filled on February 12 of this year.  His family called EMS because he was acting erratically and they suspected a drug overdose. When ask why he was here the patient stated "I took too many of my pills". When asked what he took he stated "hydrocodone". He admits that the hydrocodone (5/325) prescription belongs to his mother; it was filled 02/17/2014, 120 tablets, and the bottle is empty (as are his Percocet and Ambien bottles). He denies taking extra Ambien, stating he only takes 2 per day. He is unable to explain why the Ambien bottle is empty. He admits to taking more of his oxycodone than prescribed, hence the need to take his mother's hydrocodone because he ran out.   He states he took the hydrocodone because he was in pain. He denies any suicidal intent.  His sister and wife state they believe he did take all of his Ambien tablets, and that he "got ahold" of his mother's hydrocodone/APAP prescription. His sister states that he has a history of running out of his oxycodone tablets before his next prescription is due and when this occurs he takes whatever tablets he can get ahold of to tide him over.   Past Medical History  Diagnosis Date  . History of chicken pox   . History of migraine   . History of kidney stones    remote  . HTN (hypertension) 2014    normal renal duplex  . DDD (degenerative disc disease), lumbar     chronic pain from this on chronic narcotics  . Chronic pain     sees [facility])  . Overweight(278.02)   . Hyperlipidemia    Past Surgical History  Procedure Laterality Date  . 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 Shoulder surgery  1993    R  . Elbow surgery  2004    R   Family History  Problem Relation Age of Onset  . Hypertension Mother   . Hypertension Father   . Coronary artery disease Maternal Grandfather 30  . Heart disease Maternal Grandmother   . Cancer Mother     breast  . Cancer Sister     breast  . Cancer Cousin 57  . Diabetes Father   . Stroke Father 68    ministroke  . Coronary artery disease Father 58    CABG   History  Substance Use Topics  . Smoking status: Never Smoker   . Smokeless tobacco: Never Used  . Alcohol Use: No     Comment: rarely    Review of Systems  Unable to perform ROS   Allergies  Review of patient's allergies indicates no known allergies.  Home Medications   Prior to Admission medications   Medication Sig Start Date End Date Taking? Authorizing Provider  amLODipine (NORVASC) 10 MG tablet TAKE 1 TABLET BY MOUTH DAILY 11/13/13  Yes Eustaquio BoydenJavier Gutierrez, MD  aspirin EC 81 MG tablet Take 81 mg by mouth every other day.   Yes Historical Provider, MD  atorvastatin (LIPITOR) 40 MG tablet TAKE 1 TABLET BY MOUTH DAILY 11/13/13  Yes Eustaquio BoydenJavier Gutierrez, MD  hydrochlorothiazide (HYDRODIURIL) 12.5 MG tablet TAKE 1 TABLET BY MOUTH DAILY 11/13/13  Yes Eustaquio BoydenJavier Gutierrez, MD  methocarbamol (ROBAXIN) 500 MG tablet Take 1 tablet by mouth every 6 (six) hours as needed for muscle spasms.  04/18/12  Yes Historical Provider, MD  Multiple Vitamins-Minerals (MULTIVITAMIN PO) Take 1 tablet by mouth daily.   Yes Historical Provider, MD  zolpidem (AMBIEN) 10 MG tablet Take 10 mg by mouth at bedtime as needed for sleep.     Yes Historical Provider, MD  oxyCODONE (OXYCONTIN) 40 MG 12 hr tablet Take 40 mg by mouth every 12 (twelve) hours.    Historical Provider, MD  oxyCODONE-acetaminophen (PERCOCET) 10-325 MG per tablet Take 1 tablet by mouth every 4 (four) hours as needed for pain.     Historical Provider, MD  promethazine (PHENERGAN) 25 MG tablet TAKE ONE TABLET BY MOUTH EVERY 6 HOURS AS NEEDED FOR NAUSEA OR VOMITING Patient not taking: Reported on 04/18/2014 11/14/13   Eustaquio BoydenJavier Gutierrez, MD  Testosterone (ANDROGEL PUMP) 20.25 MG/ACT (1.62%) GEL Place 2 Act onto the skin daily. QS 1 month Patient not taking: Reported on 04/18/2014 11/26/12   Eustaquio BoydenJavier Gutierrez, MD   BP 147/97 mmHg  Pulse 98  Temp(Src) 98.3 F (36.8 C) (Oral)  Resp 20  SpO2 98%   Physical Exam  General: Well-developed, well-nourished male in no acute distress; appearance consistent with age of record HENT: normocephalic; atraumatic Eyes: pupils equal, round and reactive to light; extraocular muscles intact Neck: supple Heart: regular rate and rhythm Lungs: clear to auscultation bilaterally Abdomen: soft; nondistended; nontender; bowel sounds present Extremities: No deformity; full range of motion; pulses normal Neurologic: Awake, alert and oriented to person and place; slurred speech; motor function intact in all extremities and symmetric; no facial droop Skin: Warm and dry Psychiatric: Flat affect   ED Course  Procedures (including critical care time)   MDM  12:35 AM Fentanyl patch removed.  Nursing notes and vitals signs, including pulse oximetry, reviewed.  Summary of this visit's results, reviewed by myself:   Labs:  Results for orders placed or performed during the hospital encounter of 04/18/14 (from the past 24 hour(s))  Ethanol     Status: None   Collection Time: 04/18/14  1:39 AM  Result Value Ref Range   Alcohol, Ethyl (B) <5 0 - 9 mg/dL  Comprehensive metabolic panel     Status: Abnormal   Collection Time:  04/18/14  1:39 AM  Result Value Ref Range   Sodium 141 135 - 145 mmol/L   Potassium 3.7 3.5 - 5.1 mmol/L   Chloride 109 96 - 112 mmol/L   CO2 23 19 - 32 mmol/L   Glucose, Bld 112 (H) 70 - 99 mg/dL   BUN 9 6 - 23 mg/dL   Creatinine, Ser 2.131.19 0.50 - 1.35 mg/dL   Calcium 9.5 8.4 - 08.610.5 mg/dL   Total Protein 8.2 6.0 - 8.3 g/dL   Albumin 4.9 3.5 - 5.2 g/dL   AST 25 0 - 37 U/L   ALT 31 0 - 53 U/L   Alkaline Phosphatase 89 39 - 117 U/L   Total Bilirubin 0.4 0.3 - 1.2 mg/dL   GFR  calc non Af Amer 72 (L) >90 mL/min   GFR calc Af Amer 84 (L) >90 mL/min   Anion gap 9 5 - 15  CBC with Differential/Platelet     Status: Abnormal   Collection Time: 04/18/14  1:39 AM  Result Value Ref Range   WBC 8.4 4.0 - 10.5 K/uL   RBC 4.51 4.22 - 5.81 MIL/uL   Hemoglobin 12.9 (L) 13.0 - 17.0 g/dL   HCT 16.1 (L) 09.6 - 04.5 %   MCV 84.9 78.0 - 100.0 fL   MCH 28.6 26.0 - 34.0 pg   MCHC 33.7 30.0 - 36.0 g/dL   RDW 40.9 81.1 - 91.4 %   Platelets 360 150 - 400 K/uL   Neutrophils Relative % 69 43 - 77 %   Neutro Abs 5.9 1.7 - 7.7 K/uL   Lymphocytes Relative 24 12 - 46 %   Lymphs Abs 2.0 0.7 - 4.0 K/uL   Monocytes Relative 6 3 - 12 %   Monocytes Absolute 0.5 0.1 - 1.0 K/uL   Eosinophils Relative 1 0 - 5 %   Eosinophils Absolute 0.0 0.0 - 0.7 K/uL   Basophils Relative 0 0 - 1 %   Basophils Absolute 0.0 0.0 - 0.1 K/uL  Acetaminophen level     Status: Abnormal   Collection Time: 04/18/14  1:39 AM  Result Value Ref Range   Acetaminophen (Tylenol), Serum <10.0 (L) 10 - 30 ug/mL  Salicylate level     Status: None   Collection Time: 04/18/14  1:39 AM  Result Value Ref Range   Salicylate Lvl <4.0 2.8 - 20.0 mg/dL  Drug screen panel, emergency     Status: Abnormal   Collection Time: 04/18/14  2:59 AM  Result Value Ref Range   Opiates POSITIVE (A) NONE DETECTED   Cocaine NONE DETECTED NONE DETECTED   Benzodiazepines POSITIVE (A) NONE DETECTED   Amphetamines NONE DETECTED NONE DETECTED    Tetrahydrocannabinol NONE DETECTED NONE DETECTED   Barbiturates NONE DETECTED NONE DETECTED   4:06 AM Patient remained stable, awake and alert while in the ED. He has requested Valium to help him sleep. He was advised that since he is here for an overdose of sedating and potentially lethal drugs that any additional sedating medication is contraindicated. He was advised that a copy of this visit will be forwarded to his pain management physician, and that he needs to have a conversation with Dr. Vear Clock about his obvious drug abuse problem.   Paula Libra, MD 04/18/14 7829  Paula Libra, MD 04/18/14 5621

## 2014-04-20 NOTE — ED Notes (Signed)
Pt's chart was accessed by this Charge RN, [facility] [address]  However, the address on the bottle did not match the address on his Chart.  Since two identifying factors could not be checked, the medication was discarded.

## 2014-04-30 ENCOUNTER — Encounter (HOSPITAL_COMMUNITY): Payer: Self-pay

## 2014-04-30 ENCOUNTER — Emergency Department (HOSPITAL_COMMUNITY)
Admission: EM | Admit: 2014-04-30 | Discharge: 2014-05-01 | Disposition: A | Discharge status: Home / Self Care | Payer: Federal, State, Local not specified - PPO | Attending: Emergency Medicine | Admitting: Emergency Medicine

## 2014-04-30 DIAGNOSIS — Z8639 Personal history of other endocrine, nutritional and metabolic disease: Secondary | ICD-10-CM | POA: Insufficient documentation

## 2014-04-30 DIAGNOSIS — Z7982 Long term (current) use of aspirin: Secondary | ICD-10-CM | POA: Diagnosis not present

## 2014-04-30 DIAGNOSIS — G8929 Other chronic pain: Secondary | ICD-10-CM | POA: Insufficient documentation

## 2014-04-30 DIAGNOSIS — E785 Hyperlipidemia, unspecified: Secondary | ICD-10-CM | POA: Insufficient documentation

## 2014-04-30 DIAGNOSIS — Z79899 Other long term (current) drug therapy: Secondary | ICD-10-CM | POA: Diagnosis not present

## 2014-04-30 DIAGNOSIS — Z87442 Personal history of urinary calculi: Secondary | ICD-10-CM | POA: Insufficient documentation

## 2014-04-30 DIAGNOSIS — I10 Essential (primary) hypertension: Secondary | ICD-10-CM | POA: Diagnosis not present

## 2014-04-30 DIAGNOSIS — F1110 Opioid abuse, uncomplicated: Secondary | ICD-10-CM | POA: Insufficient documentation

## 2014-04-30 DIAGNOSIS — Z8619 Personal history of other infectious and parasitic diseases: Secondary | ICD-10-CM | POA: Diagnosis not present

## 2014-04-30 DIAGNOSIS — Z8739 Personal history of other diseases of the musculoskeletal system and connective tissue: Secondary | ICD-10-CM | POA: Insufficient documentation

## 2014-04-30 LAB — SALICYLATE LEVEL: Salicylate Lvl: <4 mg/dL (ref 2.8–20.0)

## 2014-04-30 LAB — COMPREHENSIVE METABOLIC PANEL
ALT: 30 U/L (ref 0–53)
AST: 27 U/L (ref 0–37)
Albumin: 5 g/dL (ref 3.5–5.2)
Alkaline Phosphatase: 79 U/L (ref 39–117)
Anion gap: 12 (ref 5–15)
BUN: 15 mg/dL (ref 6–23)
CO2: 23 mmol/L (ref 19–32)
Calcium: 9.4 mg/dL (ref 8.4–10.5)
Chloride: 103 mmol/L (ref 96–112)
Creatinine, Ser: 1.02 mg/dL (ref 0.50–1.35)
GFR calc Af Amer: >90 mL/min (ref >90)
GFR calc non Af Amer: 87 mL/min — ABNORMAL LOW (ref >90)
Glucose, Bld: 94 mg/dL (ref 70–99)
Potassium: 3.7 mmol/L (ref 3.5–5.1)
Sodium: 138 mmol/L (ref 135–145)
Total Bilirubin: 0.2 mg/dL — ABNORMAL LOW (ref 0.3–1.2)
Total Protein: 8.2 g/dL (ref 6.0–8.3)

## 2014-04-30 LAB — CBC
HCT: 38.7 % — ABNORMAL LOW (ref 39.0–52.0)
Hemoglobin: 13 g/dL (ref 13.0–17.0)
MCH: 28.9 pg (ref 26.0–34.0)
MCHC: 33.6 g/dL (ref 30.0–36.0)
MCV: 86 fL (ref 78.0–100.0)
Platelets: 317 10*3/uL (ref 150–400)
RBC: 4.5 MIL/uL (ref 4.22–5.81)
RDW: 13.9 % (ref 11.5–15.5)
WBC: 8.4 10*3/uL (ref 4.0–10.5)

## 2014-04-30 LAB — ACETAMINOPHEN LEVEL: Acetaminophen (Tylenol), Serum: <10 ug/mL — ABNORMAL LOW (ref 10–30)

## 2014-04-30 LAB — ETHANOL: Alcohol, Ethyl (B): <5 mg/dL (ref 0–9)

## 2014-04-30 NOTE — ED Notes (Addendum)
Patient reports he would like to detox from opioids, which he has been using for 10 years.  Last pills taken at 1700 today.

## 2014-04-30 NOTE — ED Provider Notes (Signed)
CSN: [id number]     Arrival date & time 04/30/14  2212 History  This chart was scribed for non-physician practitioner, Emilia Beck, PA-C working with Raeford Razor, MD by Gwenyth Ober, ED scribe. This patient was seen in room WTR7/WTR7 and the patient's care was started at 11:43 PM   Chief Complaint  Patient presents with  . Medical Clearance   The history is provided by the patient. No language interpreter was used.    HPI Comments: William Friedman is a 44 y.o. male who presents to the Emergency Department for detox from opoid addiction that started 10 years ago. He reports taking 80-100 mg of Hydrocodone daily and has a 0.75 mg patch for Fentanyl. He states that he wants to quit because of recurrence of withdrawal symptoms. Pt currently has a pain contract with Dr. Thyra Breed of [facility]. Pt was seen in the ED on 3/12 after he overdosed on Ambien and Hydrocodone. He denies SI/HI.  Past Medical History  Diagnosis Date  . History of chicken pox   . History of migraine   . History of kidney stones     remote  . HTN (hypertension) 2014    normal renal duplex  . DDD (degenerative disc disease), lumbar     chronic pain from this on chronic narcotics  . Chronic pain     sees [facility])  . Overweight(278.02)   . Hyperlipidemia    Past Surgical History  Procedure Laterality Date  . 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 Shoulder surgery  1993    R  . Elbow surgery  2004    R   Family History  Problem Relation Age of Onset  . Hypertension Mother   . Hypertension Father   . Coronary artery disease Maternal Grandfather 30  . Heart disease Maternal Grandmother   . Cancer Mother     breast  . Cancer Sister     breast  . Cancer Cousin 25  . Diabetes Father   . Stroke Father 75    ministroke  . Coronary artery disease Father 12    CABG   History  Substance Use Topics  . Smoking status: Never Smoker   .  Smokeless tobacco: Never Used  . Alcohol Use: No     Comment: rarely    Review of Systems  Psychiatric/Behavioral: Negative for suicidal ideas and self-injury.  All other systems reviewed and are negative.     Allergies  Review of patient's allergies indicates no known allergies.  Home Medications   Prior to Admission medications   Medication Sig Start Date End Date Taking? Authorizing Provider  amLODipine (NORVASC) 10 MG tablet TAKE 1 TABLET BY MOUTH DAILY 11/13/13   Eustaquio Boyden, MD  aspirin EC 81 MG tablet Take 81 mg by mouth every other day.    Historical Provider, MD  atorvastatin (LIPITOR) 40 MG tablet TAKE 1 TABLET BY MOUTH DAILY 11/13/13   Eustaquio Boyden, MD  hydrochlorothiazide (HYDRODIURIL) 12.5 MG tablet TAKE 1 TABLET BY MOUTH DAILY 11/13/13   Eustaquio Boyden, MD  methocarbamol (ROBAXIN) 500 MG tablet Take 1 tablet by mouth every 6 (six) hours as needed for muscle spasms.  04/18/12   Historical Provider, MD  Multiple Vitamins-Minerals (MULTIVITAMIN PO) Take 1 tablet by mouth daily.    Historical Provider, MD  oxyCODONE (OXYCONTIN) 40 MG 12 hr tablet Take 40 mg by mouth every 12 (twelve) hours.  Historical Provider, MD  oxyCODONE-acetaminophen (PERCOCET) 10-325 MG per tablet Take 1 tablet by mouth every 4 (four) hours as needed for pain.     Historical Provider, MD  promethazine (PHENERGAN) 25 MG tablet TAKE ONE TABLET BY MOUTH EVERY 6 HOURS AS NEEDED FOR NAUSEA OR VOMITING Patient not taking: Reported on 04/18/2014 11/14/13   Eustaquio BoydenJavier Gutierrez, MD  Testosterone (ANDROGEL PUMP) 20.25 MG/ACT (1.62%) GEL Place 2 Act onto the skin daily. QS 1 month Patient not taking: Reported on 04/18/2014 11/26/12   Eustaquio BoydenJavier Gutierrez, MD  zolpidem (AMBIEN) 10 MG tablet Take 10 mg by mouth at bedtime as needed for sleep.     Historical Provider, MD   BP 181/96 mmHg  Pulse 84  Temp(Src) 98 F (36.7 C) (Oral)  Resp 20  Ht 6\' 2"  (1.88 m)  Wt 245 lb (111.131 kg)  BMI 31.44 kg/m2  SpO2  100% Physical Exam  Constitutional: He is oriented to person, place, and time. He appears well-developed and well-nourished. No distress.  HENT:  Head: Normocephalic and atraumatic.  Eyes: Conjunctivae and EOM are normal.  Neck: Neck supple. No tracheal deviation present.  Cardiovascular: Normal rate and regular rhythm.  Exam reveals no gallop and no friction rub.   No murmur heard. Pulmonary/Chest: Effort normal and breath sounds normal. No respiratory distress. He has no wheezes. He has no rales. He exhibits no tenderness.  Abdominal: Soft. There is no tenderness.  Musculoskeletal: Normal range of motion.  Neurological: He is alert and oriented to person, place, and time. Coordination normal.  Speech is goal-oriented. Moves limbs without ataxia.   Skin: Skin is warm and dry.  Psychiatric: He has a normal mood and affect. His behavior is normal.  Nursing note and vitals reviewed.   ED Course  Procedures   DIAGNOSTIC STUDIES: Oxygen Saturation is 100% on RA, normal by my interpretation.    COORDINATION OF CARE: 11:50 PM Discussed treatment plan with pt which includes follow-up with outpatient resources. Pt agreed to plan.   Labs Review Labs Reviewed  ACETAMINOPHEN LEVEL - Abnormal; Notable for the following:    Acetaminophen (Tylenol), Serum <10.0 (*)    All other components within normal limits  CBC - Abnormal; Notable for the following:    HCT 38.7 (*)    All other components within normal limits  COMPREHENSIVE METABOLIC PANEL - Abnormal; Notable for the following:    Total Bilirubin 0.2 (*)    GFR calc non Af Amer 87 (*)    All other components within normal limits  ETHANOL  SALICYLATE LEVEL  URINE RAPID DRUG SCREEN (HOSP PERFORMED)    Imaging Review No results found.   EKG Interpretation None      MDM   Final diagnoses:  Opiate abuse, continuous    Patient will have outpatient resources.   I personally performed the services described in this  documentation, which was scribed in my presence. The recorded information has been reviewed and is accurate.    Emilia BeckKaitlyn Chena Chohan, PA-C 05/01/14 0136  April Palumbo, MD 05/01/14 [phone]

## 2014-05-01 ENCOUNTER — Emergency Department: Payer: Self-pay | Admitting: Internal Medicine

## 2014-05-01 ENCOUNTER — Encounter (HOSPITAL_COMMUNITY): Payer: Self-pay | Admitting: *Deleted

## 2014-05-01 LAB — COMPREHENSIVE METABOLIC PANEL
ALT: 28 U/L
AST: 25 U/L
Albumin: 4.9 g/dL
Alkaline Phosphatase: 84 U/L
Anion Gap: 12 (ref 7–16)
BILIRUBIN TOTAL: 0.3 mg/dL
BUN: 13 mg/dL
CO2: 23 mmol/L
CREATININE: 0.97 mg/dL
Calcium, Total: 9.5 mg/dL
Chloride: 106 mmol/L
EGFR (African American): >60
Glucose: 114 mg/dL — ABNORMAL HIGH
Potassium: 4.4 mmol/L
Sodium: 141 mmol/L
Total Protein: 8.2 g/dL — ABNORMAL HIGH

## 2014-05-01 LAB — ACETAMINOPHEN LEVEL

## 2014-05-01 LAB — URINALYSIS, COMPLETE
BILIRUBIN, UR: NEGATIVE
Bacteria: NONE SEEN
Blood: NEGATIVE
GLUCOSE, UR: NEGATIVE mg/dL (ref 0–75)
KETONE: NEGATIVE
LEUKOCYTE ESTERASE: NEGATIVE
Nitrite: NEGATIVE
PH: 5 (ref 4.5–8.0)
PROTEIN: NEGATIVE
Specific Gravity: 1.018 (ref 1.003–1.030)
Squamous Epithelial: NONE SEEN
WBC UR: 2 /HPF (ref 0–5)

## 2014-05-01 LAB — CBC
HCT: 40.3 % (ref 40.0–52.0)
HGB: 13.3 g/dL (ref 13.0–18.0)
MCH: 28.3 pg (ref 26.0–34.0)
MCHC: 33 g/dL (ref 32.0–36.0)
MCV: 86 fL (ref 80–100)
Platelet: 326 10*3/uL (ref 150–440)
RBC: 4.7 10*6/uL (ref 4.40–5.90)
RDW: 14 % (ref 11.5–14.5)
WBC: 6.6 10*3/uL (ref 3.8–10.6)

## 2014-05-01 LAB — SALICYLATE LEVEL

## 2014-05-01 LAB — DRUG SCREEN, URINE
AMPHETAMINES, UR SCREEN: NEGATIVE
Barbiturates, Ur Screen: NEGATIVE
Benzodiazepine, Ur Scrn: POSITIVE
CANNABINOID 50 NG, UR ~~LOC~~: NEGATIVE
Cocaine Metabolite,Ur ~~LOC~~: NEGATIVE
MDMA (Ecstasy)Ur Screen: NEGATIVE
Methadone, Ur Screen: NEGATIVE
OPIATE, UR SCREEN: NEGATIVE
PHENCYCLIDINE (PCP) UR S: NEGATIVE
TRICYCLIC, UR SCREEN: POSITIVE

## 2014-05-01 LAB — ETHANOL: Ethanol: <5 mg/dL

## 2014-05-01 NOTE — BH Assessment (Signed)
Tele Assessment Note   William Friedman is a 44 y.o. male who voluntarily presents to [facility] for opioid detox. Pt denies SI/HI/AVH. 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  Pt states he takes 6-10 s hydrocodone/oxycodone pills, daily.  His last use was 04/30/14, he took 2 pills.  Pt says she was prescribed fentanyl patches for increased pain, denies absuing fentanyl patches.  Pt denies current w/d sxs but says he when w/d he is nauseous, irritable and diarrhea and body aches.  Pt is dx with Degenerative Disc Disease.  Pt denies seizure/blackouts.  This Clinical research associate explained that [facility] does not offer inpt programming for opioid addiction, outpatient referrals could be provided.  This discussed interview with Hulan Fess, NP and informed Dr. Nicanor Alcon, both agreed with disposition.      Axis I: Opioid use disorder, Severe Axis II: Deferred Axis III:  Past Medical History  Diagnosis Date  . History of chicken pox   . History of migraine   . History of kidney stones     remote  . HTN (hypertension) 2014    normal renal duplex  . DDD (degenerative disc disease), lumbar     chronic pain from this on chronic narcotics  . Chronic pain     sees [facility])  . Overweight(278.02)   . Hyperlipidemia    Axis IV: other psychosocial or environmental problems, problems related to social environment and problems with primary support group Axis V: 51-60 moderate symptoms  Past Medical History:  Past Medical History  Diagnosis Date  . History of chicken pox   . History of migraine   . History of kidney stones     remote  . HTN (hypertension) 2014    normal renal duplex  . DDD (degenerative disc disease), lumbar     chronic pain from this on chronic narcotics  . Chronic pain     sees [facility])  . Overweight(278.02)    . Hyperlipidemia     Past Surgical History  Procedure Laterality Date  . 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 Shoulder surgery  1993    R  . Elbow surgery  2004    R    Family History:  Family History  Problem Relation Age of Onset  . Hypertension Mother   . Hypertension Father   . Coronary artery disease Maternal Grandfather 30  . Heart disease Maternal Grandmother   . Cancer Mother     breast  . Cancer Sister     breast  . Cancer Cousin 56  . Diabetes Father   . Stroke Father 74    ministroke  . Coronary artery disease Father 76    CABG    Social History:  reports that he has never smoked. He has never used smokeless tobacco. He reports that he uses illicit drugs (Fentanyl). He reports that he does not drink alcohol.  Additional Social History:  Alcohol / Drug Use Pain Medications: Hydrocodone, Oxycodone, Fentanyl  Prescriptions: See MAR  Over the Counter: See MAR  History of alcohol / drug use?: Yes Longest period of sobriety (when/how long): None  Negative Consequences of Use: Personal relationships Withdrawal Symptoms: Other (Comment) (No current w/d sxs ) Substance #1 Name of Substance 1: Opiates--hydrocodone, fentanyl, oxycodone  1 - Age of First Use: 30's  1 - Amount (size/oz): 6-10 10mg  Pills  1 - Frequency: Daily  1 - Duration: On-going  1 - Last Use / Amount: 04/30/14  CIWA: CIWA-Ar BP: 181/96 mmHg Pulse Rate: 84 COWS:    PATIENT STRENGTHS: (choose at least two) Communication skills Motivation for treatment/growth  Allergies: No Known Allergies  Home Medications:  (Not in a hospital admission)  OB/GYN Status:  No LMP for male patient.  General Assessment Data Location of Assessment: [facility] ED Is this a Tele or Face-to-Face Assessment?: Tele Assessment Is this an Initial Assessment or a Re-assessment for this encounter?: Initial Assessment Living Arrangements: Alone Can pt return to current living arrangement?:  Yes Admission Status: Voluntary Is patient capable of signing voluntary admission?: Yes Transfer from: Home Referral Source: Self/Family/Friend  Medical Screening Exam [facility] Walk-in ONLY) Medical Exam completed: No Reason for MSE not completed: Other: (None )  [facility] Living Arrangements: Alone Name of Psychiatrist: None  Name of Therapist: None   Education Status Is patient currently in school?: No Current Grade: None  Highest grade of school patient has completed: None  Name of school: None  Contact person: None   Risk to self with the past 6 months Suicidal Ideation: No Suicidal Intent: No Is patient at risk for suicide?: No Suicidal Plan?: No Access to Means: No What has been your use of drugs/alcohol within the last 12 months?: Abusing--Opiates  Previous Attempts/Gestures: No How many times?: 0 Other Self Harm Risks: None  Triggers for Past Attempts: None known Intentional Self Injurious Behavior: None Family Suicide History: No Recent stressful life event(s): Recent negative physical changes (Worsening pain associated with back problems ) Persecutory voices/beliefs?: No Depression: Yes Depression Symptoms: Loss of interest in usual pleasures Substance abuse history and/or treatment for substance abuse?: Yes Suicide prevention information given to non-admitted patients: Not applicable  Risk to Others within the past 6 months Homicidal Ideation: No Thoughts of Harm to Others: No Current Homicidal Intent: No Current Homicidal Plan: No Access to Homicidal Means: No Identified Victim: None  History of harm to others?: No Assessment of Violence: None Noted Violent Behavior Description: None  Does patient have access to weapons?: No Criminal Charges Pending?: No Does patient have a court date: No  Psychosis Hallucinations: None noted Delusions: None noted  Mental Status Report Appearance/Hygiene: Other (Comment) (Appropriate ) Eye Contact:  Good Motor Activity: Unremarkable Speech: Logical/coherent Level of Consciousness: Alert Mood: Other (Comment) (Appropriate ) Affect: Appropriate to circumstance Anxiety Level: None Thought Processes: Coherent, Relevant Judgement: Unimpaired Orientation: Person, Place, Time, Situation Obsessive Compulsive Thoughts/Behaviors: None  Cognitive Functioning Concentration: Normal Memory: Recent Intact, Remote Intact IQ: Average Insight: Good Impulse Control: Good Appetite: Good Weight Loss: 0 Weight Gain: 0 Sleep: No Change Total Hours of Sleep: 7 Vegetative Symptoms: None  ADLScreening [facility] Assessment Services) Patient's cognitive ability adequate to safely complete daily activities?: Yes Patient able to express need for assistance with ADLs?: Yes Independently performs ADLs?: Yes (appropriate for developmental age)  Prior Inpatient Therapy Prior Inpatient Therapy: No Prior Therapy Dates: None  Prior Therapy Facilty/Provider(s): None  Reason for Treatment: None   Prior Outpatient Therapy Prior Outpatient Therapy: No Prior Therapy Dates: None  Prior Therapy Facilty/Provider(s): None  Reason for Treatment: None   ADL Screening (condition at time of admission) Patient's cognitive ability adequate to safely complete daily activities?: Yes Is the patient deaf or have difficulty hearing?: No Does the patient have difficulty seeing, even when wearing glasses/contacts?: No Does the patient have  difficulty concentrating, remembering, or making decisions?: No Patient able to express need for assistance with ADLs?: Yes Does the patient have difficulty dressing or bathing?: No Independently performs ADLs?: Yes (appropriate for developmental age) Does the patient have difficulty walking or climbing stairs?: No Weakness of Legs: None Weakness of Arms/Hands: None  Home Assistive Devices/Equipment Home Assistive Devices/Equipment: None  Therapy Consults (therapy consults require a  physician order) PT Evaluation Needed: No OT Evalulation Needed: No SLP Evaluation Needed: No Abuse/Neglect Assessment (Assessment to be complete while patient is alone) Physical Abuse: Denies Verbal Abuse: Denies Sexual Abuse: Denies Exploitation of patient/patient's resources: Denies Self-Neglect: Denies Values / Beliefs Cultural Requests During Hospitalization: None Spiritual Requests During Hospitalization: None Consults Spiritual Care Consult Needed: No Social Work Consult Needed: No Merchant navy officer (For Healthcare) Does patient have an advance directive?: No Would patient like information on creating an advanced directive?: No - patient declined information    Additional Information 1:1 In Past 12 Months?: No CIRT Risk: No Elopement Risk: No Does patient have medical clearance?: Yes     Disposition:  Disposition Initial Assessment Completed for this Encounter: Yes Disposition of Patient: Outpatient treatment, Referred to (Per Hulan Fess, NP provide outpt referrals ) Type of outpatient treatment: Adult (Per Hulan Fess, NP provide outpatient referrals ) Patient referred to: Other (Comment) (Per Hulan Fess, Np provide outpatient referrals )  Murrell Redden 05/01/2014 1:19 AM

## 2014-05-01 NOTE — ED Notes (Signed)
Telepsych in progress. 

## 2014-05-01 NOTE — ED Notes (Signed)
Pt requesting detox from opiates.  Has been on it for years d/t back pain.  Pt [facility] and was told to come here for it.  Pt is calm and cooperative at this time.  No other complaints.

## 2014-05-03 ENCOUNTER — Emergency Department: Payer: Self-pay | Admitting: Emergency Medicine

## 2014-05-04 ENCOUNTER — Ambulatory Visit (INDEPENDENT_AMBULATORY_CARE_PROVIDER_SITE_OTHER): Payer: Federal, State, Local not specified - PPO | Admitting: Family Medicine

## 2014-05-04 ENCOUNTER — Encounter: Payer: Self-pay | Admitting: Family Medicine

## 2014-05-04 VITALS — BP 150/106 | HR 88 | Temp 98.0°F | Wt 237.0 lb

## 2014-05-04 DIAGNOSIS — E291 Testicular hypofunction: Secondary | ICD-10-CM | POA: Diagnosis not present

## 2014-05-04 DIAGNOSIS — F1123 Opioid dependence with withdrawal: Secondary | ICD-10-CM | POA: Diagnosis not present

## 2014-05-04 DIAGNOSIS — E785 Hyperlipidemia, unspecified: Secondary | ICD-10-CM

## 2014-05-04 DIAGNOSIS — F1193 Opioid use, unspecified with withdrawal: Secondary | ICD-10-CM | POA: Insufficient documentation

## 2014-05-04 DIAGNOSIS — I10 Essential (primary) hypertension: Secondary | ICD-10-CM

## 2014-05-04 MED ORDER — ONDANSETRON HCL 4 MG PO TABS: 4.0000 mg | ORAL_TABLET | Freq: Three times a day (TID) | ORAL | Status: DC | PRN

## 2014-05-04 MED ORDER — ATORVASTATIN CALCIUM 40 MG PO TABS: 40.0000 mg | ORAL_TABLET | Freq: Every day | ORAL | Status: DC

## 2014-05-04 MED ORDER — AMLODIPINE BESYLATE 10 MG PO TABS: 10.0000 mg | ORAL_TABLET | Freq: Every day | ORAL | Status: DC

## 2014-05-04 MED ORDER — CLONIDINE HCL 0.1 MG PO TABS: 0.1000 mg | ORAL_TABLET | Freq: Two times a day (BID) | ORAL | Status: DC | PRN

## 2014-05-04 NOTE — Progress Notes (Signed)
Pre visit review using our clinic review tool, if applicable. No additional management support is needed unless otherwise documented below in the visit note. 

## 2014-05-04 NOTE — Assessment & Plan Note (Signed)
No effect noted on testosterone.

## 2014-05-04 NOTE — Progress Notes (Signed)
BP 150/106 mmHg  Pulse 88  Temp(Src) 98 F (36.7 C)  Wt 237 lb (107.502 kg)   CC: med refill visit  Subjective:    Patient ID: William Friedman, male    DOB: 11-26-1970, 44 y.o.   MRN: [id number]  HPI: William Friedman is a 44 y.o. male presenting on 05/04/2014 for Follow-up   Not seen here since 10/2012.   Seen at ER twice in the last 2 weeks for drug overdose then detox. Has been long term on fentanyl patches and percocets and ambien for DDD of spine. Took hydrocodone after he ran out of his regular oxycodone prescription. Last narcotic use was 04/30/2014 at 5pm. Provided with outpatient resources for opioid addiction. Actually went to inpatient [facility] in [facility] for 2 days for detox - left early, did not like this location. Staying restless and irritated. No nausea, mild diarrhea. Has been taking robaxin, zofran and clonidine (last taken yesterday 3am).   BP in ER was markedly elevated up to 181/96 (during acute detox).   Pain contract with Dr Thyra Breed of [facility]. Last seen 2 months ago.   Taking ambien nightly .   Relevant past medical, surgical, family and social history reviewed and updated as indicated. Interim medical history since our last visit reviewed. Allergies and medications reviewed and updated. Current Outpatient Prescriptions on File Prior to Visit  Medication Sig  . aspirin EC 81 MG tablet Take 81 mg by mouth every other day.  . methocarbamol (ROBAXIN) 500 MG tablet Take 1 tablet by mouth every 6 (six) hours as needed for muscle spasms.   . Multiple Vitamins-Minerals (MULTIVITAMIN PO) Take 1 tablet by mouth daily.  Marland Kitchen zolpidem (AMBIEN) 10 MG tablet Take 10 mg by mouth at bedtime as needed for sleep.    No current facility-administered medications on file prior to visit.    Review of Systems Per HPI unless specifically indicated above     Objective:    BP 150/106 mmHg  Pulse 88  Temp(Src) 98 F (36.7 C)  Wt 237 lb  (107.502 kg)  Wt Readings from Last 3 Encounters:  05/04/14 237 lb (107.502 kg)  04/30/14 245 lb (111.131 kg)  10/22/12 246 lb (111.585 kg)    Physical Exam  Constitutional: He appears well-developed and well-nourished. No distress.  HENT:  Head: Normocephalic and atraumatic.  Mouth/Throat: Oropharynx is clear and moist. No oropharyngeal exudate.  Eyes: Conjunctivae and EOM are normal. Pupils are equal, round, and reactive to light. No scleral icterus.  Neck: Normal range of motion. Neck supple. No thyromegaly present.  Cardiovascular: Normal rate, regular rhythm, normal heart sounds and intact distal pulses.   No murmur heard. Pulmonary/Chest: Effort normal and breath sounds normal. No respiratory distress. He has no wheezes. He has no rales.  Musculoskeletal: He exhibits no edema.  Lymphadenopathy:    He has no cervical adenopathy.  Neurological:  No tremor  Skin: Skin is warm and dry. No rash noted.  Psychiatric: He has a normal mood and affect.  Nursing note and vitals reviewed.      Assessment & Plan:   Problem List Items Addressed This Visit    Narcotic withdrawal - Primary    Overall doing well with detox from opiates.  I will prescribe clonidine 0.1mg  bid to take over next week PRN withdrawal sxs, as well as zofran prn nausea and advised ok to continue robaxin (has script at home). Discussed remaining off narcotics. Pt aware of outpatient resources  available if needed. Recheck in 1 wk.      Hypogonadism male    No effect noted on testosterone.      HTN (hypertension)    Restart amlodipine 5mg  x 4 d then increase to 10mg  daily. Reassess at f/u visit in 1 wk.      Relevant Medications   amLODIpine (NORVASC) tablet   atorvastatin (LIPITOR) tablet   cloNIDine (CATAPRES) tablet   HLD (hyperlipidemia)    Restart lipitor 40mg  nightly. Check FLP when returns fasting next week.      Relevant Medications   amLODIpine (NORVASC) tablet   atorvastatin (LIPITOR)  tablet   cloNIDine (CATAPRES) tablet       Follow up plan: Return in about 1 week (around 05/11/2014) for follow up visit.

## 2014-05-04 NOTE — Patient Instructions (Addendum)
Restart amlodipine 10mg  1/2 tablet daily for 4 days then increase to whole tablet daily. Restart lipitor (atorvastatin) 40mg  at night time. Goal blood pressure <140/90. May use robaxin (at pharmacy) as needed for muscle spasm/tightness, I'll refill 1 more week of clonidine may take up to twice daily as needed for withdrawal symptoms which should help blood pressure as well. I'll also refill zofran for a few more days. Return in 1 week for follow up visit, come in fasting for labs.

## 2014-05-04 NOTE — Assessment & Plan Note (Signed)
Overall doing well with detox from opiates.  I will prescribe clonidine 0.1mg  bid to take over next week PRN withdrawal sxs, as well as zofran prn nausea and advised ok to continue robaxin (has script at home). Discussed remaining off narcotics. Pt aware of outpatient resources available if needed. Recheck in 1 wk.

## 2014-05-04 NOTE — Assessment & Plan Note (Signed)
Restart amlodipine 5mg  x 4 d then increase to 10mg  daily. Reassess at f/u visit in 1 wk.

## 2014-05-04 NOTE — Assessment & Plan Note (Signed)
Restart lipitor 40mg  nightly. Check FLP when returns fasting next week.

## 2014-05-11 ENCOUNTER — Encounter: Payer: Self-pay | Admitting: Family Medicine

## 2014-05-11 ENCOUNTER — Ambulatory Visit (INDEPENDENT_AMBULATORY_CARE_PROVIDER_SITE_OTHER): Payer: Federal, State, Local not specified - PPO | Admitting: Family Medicine

## 2014-05-11 VITALS — BP 148/100 | HR 64 | Temp 98.0°F | Wt 239.5 lb

## 2014-05-11 DIAGNOSIS — E785 Hyperlipidemia, unspecified: Secondary | ICD-10-CM | POA: Diagnosis not present

## 2014-05-11 DIAGNOSIS — G8929 Other chronic pain: Secondary | ICD-10-CM | POA: Diagnosis not present

## 2014-05-11 DIAGNOSIS — F1123 Opioid dependence with withdrawal: Secondary | ICD-10-CM | POA: Diagnosis not present

## 2014-05-11 DIAGNOSIS — F5104 Psychophysiologic insomnia: Secondary | ICD-10-CM | POA: Insufficient documentation

## 2014-05-11 DIAGNOSIS — G4700 Insomnia, unspecified: Secondary | ICD-10-CM

## 2014-05-11 DIAGNOSIS — I10 Essential (primary) hypertension: Secondary | ICD-10-CM | POA: Diagnosis not present

## 2014-05-11 DIAGNOSIS — F1193 Opioid use, unspecified with withdrawal: Secondary | ICD-10-CM

## 2014-05-11 LAB — LIPID PANEL
Cholesterol: 204 mg/dL — ABNORMAL HIGH (ref 0–200)
HDL: 38.2 mg/dL — ABNORMAL LOW (ref >39.00)
NonHDL: 165.8
TRIGLYCERIDES: 251 mg/dL — AB (ref 0.0–149.0)
Total CHOL/HDL Ratio: 5
VLDL: 50.2 mg/dL — AB (ref 0.0–40.0)

## 2014-05-11 LAB — TSH: TSH: 1.28 u[IU]/mL (ref 0.35–4.50)

## 2014-05-11 LAB — LDL CHOLESTEROL, DIRECT: LDL DIRECT: 126 mg/dL

## 2014-05-11 MED ORDER — METHOCARBAMOL 750 MG PO TABS: 750.0000 mg | ORAL_TABLET | Freq: Three times a day (TID) | ORAL | Status: DC | PRN

## 2014-05-11 NOTE — Assessment & Plan Note (Signed)
Check FLP today. Back on lipitor 40mg  daily.

## 2014-05-11 NOTE — Assessment & Plan Note (Signed)
We will take on refilling robaxin for him. Discussed sedation precautions. Increase dose to 750mg  daily. Do not mix with [place]

## 2014-05-11 NOTE — Assessment & Plan Note (Signed)
Overall resolved. Off zofran, off clonidine.

## 2014-05-11 NOTE — Assessment & Plan Note (Signed)
He will call when [place] script is next due.

## 2014-05-11 NOTE — Progress Notes (Signed)
Pre visit review using our clinic review tool, if applicable. No additional management support is needed unless otherwise documented below in the visit note. 

## 2014-05-11 NOTE — Assessment & Plan Note (Signed)
Chronic, stable.  Continue amlodipine 10 mg daily 

## 2014-05-11 NOTE — Patient Instructions (Addendum)
Continue only with amlodipine for blood pressure - monitor pressures at home and let me know if consistently >140/90 to start up water pill again.  Continue lipitor.  Labwork today.  Return in 3-4 months for physical.

## 2014-05-11 NOTE — Progress Notes (Signed)
BP 148/100 mmHg  Pulse 64  Temp(Src) 98 F (36.7 C) (Oral)  Wt 239 lb 8 oz (108.636 kg)   CC: 1 wk f/u visit  Subjective:    Patient ID: William Friedman, male    DOB: 1970/12/13, 44 y.o.   MRN: [id number]  HPI: William Friedman is a 44 y.o. male presenting on 05/11/2014 for Follow-up   See prior note for details. Seen here 05/04/2014 with narcotic withdrawal. Treated with short course clonidine 0.1mg  bid prn withdrawal sxs, and continued zofran and robaxin prn. Not needing zofran. Last clonidine pill was yesterday 8pm.   HTN - amlodipine was restarted but his first dose was today. Taking  daily.   HLD - lipitor  daily was restarted.  Here fasting for labs.  Prior saw Dr Vear Clock for pain management - requests we continue prescribing robaxin and requests higher dose as he is now off narcotics.   Relevant past medical, surgical, family and social history reviewed and updated as indicated. Interim medical history since our last visit reviewed. Allergies and medications reviewed and updated. Current Outpatient Prescriptions on File Prior to Visit  Medication Sig  . amLODipine (NORVASC) 10 MG tablet Take 1 tablet (10 mg total) by mouth daily.  Marland Kitchen aspirin EC 81 MG tablet Take 81 mg by mouth every other day.  Marland Kitchen atorvastatin (LIPITOR) 40 MG tablet Take 1 tablet (40 mg total) by mouth daily.  . Multiple Vitamins-Minerals (MULTIVITAMIN PO) Take 1 tablet by mouth daily.  Marland Kitchen zolpidem (AMBIEN) 10 MG tablet Take 10 mg by mouth at bedtime as needed for sleep.    No current facility-administered medications on file prior to visit.    Review of Systems Per HPI unless specifically indicated above     Objective:    BP 148/100 mmHg  Pulse 64  Temp(Src) 98 F (36.7 C) (Oral)  Wt 239 lb 8 oz (108.636 kg)  Wt Readings from Last 3 Encounters:  05/11/14 239 lb 8 oz (108.636 kg)  05/04/14 237 lb (107.502 kg)  04/30/14 245 lb (111.131 kg)    Physical Exam  Constitutional:  He appears well-developed and well-nourished. No distress.  HENT:  Mouth/Throat: Oropharynx is clear and moist. No oropharyngeal exudate.  Cardiovascular: Normal rate, regular rhythm, normal heart sounds and intact distal pulses.   No murmur heard. Pulmonary/Chest: Effort normal and breath sounds normal. No respiratory distress. He has no wheezes. He has no rales.  Musculoskeletal: He exhibits no edema.  No tremor  Skin: Skin is warm and dry. No rash noted.  Psychiatric: He has a normal mood and affect.  Nursing note and vitals reviewed.  Results for orders placed or performed during the hospital encounter of 04/30/14  Acetaminophen level  Result Value Ref Range   Acetaminophen (Tylenol), Serum <10.0 (L) 10 - 30 ug/mL  CBC  Result Value Ref Range   WBC 8.4 4.0 - 10.5 K/uL   RBC 4.50 4.22 - 5.81 MIL/uL   Hemoglobin 13.0 13.0 - 17.0 g/dL   HCT 01.0 (L) 27.2 - 53.6 %   MCV 86.0 78.0 - 100.0 fL   MCH 28.9 26.0 - 34.0 pg   MCHC 33.6 30.0 - 36.0 g/dL   RDW 64.4 03.4 - 74.2 %   Platelets 317 150 - 400 K/uL  Comprehensive metabolic panel  Result Value Ref Range   Sodium 138 135 - 145 mmol/L   Potassium 3.7 3.5 - 5.1 mmol/L   Chloride 103 96 - 112 mmol/L   CO2  23 19 - 32 mmol/L   Glucose, Bld 94 70 - 99 mg/dL   BUN 15 6 - 23 mg/dL   Creatinine, Ser 1.611.02 0.50 - 1.35 mg/dL   Calcium 9.4 8.4 - 09.610.5 mg/dL   Total Protein 8.2 6.0 - 8.3 g/dL   Albumin 5.0 3.5 - 5.2 g/dL   AST 27 0 - 37 U/L   ALT 30 0 - 53 U/L   Alkaline Phosphatase 79 39 - 117 U/L   Total Bilirubin 0.2 (L) 0.3 - 1.2 mg/dL   GFR calc non Af Amer 87 (L) >90 mL/min   GFR calc Af Amer >90 >90 mL/min   Anion gap 12 5 - 15  Ethanol (ETOH)  Result Value Ref Range   Alcohol, Ethyl (B) <5 0 - 9 mg/dL  Salicylate level  Result Value Ref Range   Salicylate Lvl <4.0 2.8 - 20.0 mg/dL      Assessment & Plan:   Problem List Items Addressed This Visit    Narcotic withdrawal    Overall resolved. Off zofran, off clonidine.       Insomnia    He will call when [place] is next due.      HTN (hypertension) - Primary    Chronic, stable. Continue amlodipine 10mg  daily.      Relevant Orders   TSH   HLD (hyperlipidemia)    Check FLP today. Back on lipitor 40mg  daily.      Relevant Orders   Lipid panel   TSH   Chronic pain    We will take on refilling robaxin for him. Discussed sedation precautions. Increase dose to 750mg  daily. Do not mix with ambien      Relevant Medications   methocarbamol (ROBAXIN) tablet       Follow up plan: Return in about 4 months (around 09/10/2014), or as needed, for annual exam, prior fasting for blood work.

## 2014-05-12 ENCOUNTER — Telehealth: Payer: Self-pay | Admitting: Family Medicine

## 2014-05-12 ENCOUNTER — Encounter: Payer: Self-pay | Admitting: *Deleted

## 2014-05-12 NOTE — Telephone Encounter (Signed)
emmi emailed °

## 2014-06-17 ENCOUNTER — Other Ambulatory Visit: Payer: Self-pay | Admitting: Family Medicine

## 2014-06-18 NOTE — Telephone Encounter (Signed)
Ok to refill 

## 2014-07-08 ENCOUNTER — Other Ambulatory Visit: Payer: Self-pay | Admitting: Family Medicine

## 2014-08-03 ENCOUNTER — Other Ambulatory Visit: Payer: Self-pay | Admitting: Family Medicine

## 2014-09-02 ENCOUNTER — Other Ambulatory Visit: Payer: Self-pay | Admitting: Family Medicine

## 2014-09-03 NOTE — Telephone Encounter (Signed)
Sent. Thanks.   

## 2014-09-03 NOTE — Telephone Encounter (Signed)
Ok to refill in Dr. Timoteo Expose absence? Last filled 06/18/14 #[id number]

## 2014-09-12 ENCOUNTER — Other Ambulatory Visit: Payer: Self-pay | Admitting: Family Medicine

## 2014-09-12 DIAGNOSIS — Z125 Encounter for screening for malignant neoplasm of prostate: Secondary | ICD-10-CM

## 2014-09-12 DIAGNOSIS — E785 Hyperlipidemia, unspecified: Secondary | ICD-10-CM

## 2014-09-12 DIAGNOSIS — I10 Essential (primary) hypertension: Secondary | ICD-10-CM

## 2014-09-12 DIAGNOSIS — E291 Testicular hypofunction: Secondary | ICD-10-CM

## 2014-09-12 DIAGNOSIS — R7303 Prediabetes: Secondary | ICD-10-CM

## 2014-09-14 ENCOUNTER — Other Ambulatory Visit: Payer: Federal, State, Local not specified - PPO

## 2014-09-21 ENCOUNTER — Encounter: Payer: Federal, State, Local not specified - PPO | Admitting: Family Medicine

## 2015-05-25 ENCOUNTER — Other Ambulatory Visit: Payer: Self-pay | Admitting: Family Medicine

## 2015-06-02 ENCOUNTER — Other Ambulatory Visit: Payer: Self-pay

## 2015-06-03 ENCOUNTER — Other Ambulatory Visit (INDEPENDENT_AMBULATORY_CARE_PROVIDER_SITE_OTHER): Payer: Federal, State, Local not specified - PPO

## 2015-06-03 DIAGNOSIS — R7303 Prediabetes: Secondary | ICD-10-CM

## 2015-06-03 DIAGNOSIS — Z125 Encounter for screening for malignant neoplasm of prostate: Secondary | ICD-10-CM | POA: Diagnosis not present

## 2015-06-03 DIAGNOSIS — R7989 Other specified abnormal findings of blood chemistry: Secondary | ICD-10-CM | POA: Diagnosis not present

## 2015-06-03 DIAGNOSIS — I10 Essential (primary) hypertension: Secondary | ICD-10-CM | POA: Diagnosis not present

## 2015-06-03 DIAGNOSIS — E785 Hyperlipidemia, unspecified: Secondary | ICD-10-CM | POA: Diagnosis not present

## 2015-06-03 LAB — LDL CHOLESTEROL, DIRECT: Direct LDL: 99 mg/dL

## 2015-06-03 LAB — LIPID PANEL
Cholesterol: 203 mg/dL — ABNORMAL HIGH (ref 0–200)
HDL: 39.7 mg/dL (ref >39.00)
NonHDL: 163.42
Total CHOL/HDL Ratio: 5
Triglycerides: 311 mg/dL — ABNORMAL HIGH (ref 0.0–149.0)
VLDL: 62.2 mg/dL — ABNORMAL HIGH (ref 0.0–40.0)

## 2015-06-03 LAB — BASIC METABOLIC PANEL
BUN: 12 mg/dL (ref 6–23)
CO2: 29 mEq/L (ref 19–32)
CREATININE: 1.03 mg/dL (ref 0.40–1.50)
Calcium: 9.7 mg/dL (ref 8.4–10.5)
Chloride: 105 mEq/L (ref 96–112)
GFR: 82.44 mL/min (ref >60.00)
GLUCOSE: 109 mg/dL — AB (ref 70–99)
POTASSIUM: 4.5 meq/L (ref 3.5–5.1)
Sodium: 141 mEq/L (ref 135–145)

## 2015-06-03 LAB — HEMOGLOBIN A1C: Hgb A1c MFr Bld: 5.7 % (ref 4.6–6.5)

## 2015-06-03 LAB — PSA: PSA: 0.44 ng/mL (ref 0.10–4.00)

## 2015-06-07 ENCOUNTER — Encounter: Payer: Self-pay | Admitting: Family Medicine

## 2015-06-07 ENCOUNTER — Ambulatory Visit (INDEPENDENT_AMBULATORY_CARE_PROVIDER_SITE_OTHER): Payer: Federal, State, Local not specified - PPO | Admitting: Family Medicine

## 2015-06-07 VITALS — BP 130/84 | HR 56 | Temp 98.2°F | Ht 73.0 in | Wt 242.8 lb

## 2015-06-07 DIAGNOSIS — G4700 Insomnia, unspecified: Secondary | ICD-10-CM

## 2015-06-07 DIAGNOSIS — I10 Essential (primary) hypertension: Secondary | ICD-10-CM | POA: Diagnosis not present

## 2015-06-07 DIAGNOSIS — Z0000 Encounter for general adult medical examination without abnormal findings: Secondary | ICD-10-CM

## 2015-06-07 DIAGNOSIS — G8929 Other chronic pain: Secondary | ICD-10-CM | POA: Diagnosis not present

## 2015-06-07 DIAGNOSIS — E669 Obesity, unspecified: Secondary | ICD-10-CM | POA: Diagnosis not present

## 2015-06-07 DIAGNOSIS — E66811 Obesity, class 1: Secondary | ICD-10-CM

## 2015-06-07 DIAGNOSIS — R7303 Prediabetes: Secondary | ICD-10-CM

## 2015-06-07 DIAGNOSIS — E785 Hyperlipidemia, unspecified: Secondary | ICD-10-CM

## 2015-06-07 DIAGNOSIS — F5104 Psychophysiologic insomnia: Secondary | ICD-10-CM

## 2015-06-07 MED ORDER — FENOFIBRATE 145 MG PO TABS: 145.0000 mg | ORAL_TABLET | Freq: Every day | ORAL | Status: DC

## 2015-06-07 MED ORDER — ZOLPIDEM TARTRATE 10 MG PO TABS: 10.0000 mg | ORAL_TABLET | Freq: Every evening | ORAL | Status: DC | PRN

## 2015-06-07 NOTE — Progress Notes (Signed)
BP 130/84 mmHg  Pulse 56  Temp(Src) 98.2 F (36.8 C) (Oral)  Ht  (1.854 m)  Wt 242 lb 12 oz (110.111 kg)  BMI 32.03 kg/m2   CC: CPE  Subjective:    Patient ID: William Friedman, male    DOB: 09-29-1970, 45 y.o.   MRN: [id number]  HPI: William Friedman is a 45 y.o. male presenting on 06/07/2015 for Annual Exam   Suboxone started 02/2015. Sees Dr Forrestine Him in [facility]. Also on methocarbamole  BID-TID. Also on ambien nightly.   Chronic insomnia - maintenance > initiation. Discussed sleep hygiene.   Preventative: Flu shot - did not receive Tetanus unsure ~2012  Seat belt use discussed Sunscreen use discussed. No changing moles on skin.   Caffeine: sodas Dr Reino Kent - 1L/day  Lives with wife and daughter, 1 dog  Occupation: special agent - [employer]  Edu: BS  Activity: golfing, likes sports. Planning on restarting gym  Diet: poor - not regular fruits/vegetables, not regular water  Relevant past medical, surgical, family and social history reviewed and updated as indicated. Interim medical history since our last visit reviewed. Allergies and medications reviewed and updated. Current Outpatient Prescriptions on File Prior to Visit  Medication Sig  . amLODipine (NORVASC) 10 MG tablet TAKE 1 TABLET BY MOUTH DAILY  . aspirin EC 81 MG tablet Take 81 mg by mouth every other day.  Marland Kitchen atorvastatin (LIPITOR) 40 MG tablet TAKE 1 TABLET BY MOUTH DAILY  . methocarbamol (ROBAXIN) 750 MG tablet TAKE 1 TABLET BY MOUTH 3 TIMES A DAY AS NEEDED FOR MUSCLE SPASMS.  . Multiple Vitamins-Minerals (MULTIVITAMIN PO) Take 1 tablet by mouth daily.   No current facility-administered medications on file prior to visit.    Review of Systems  Constitutional: Negative for fever, chills, activity change, appetite change, fatigue and unexpected weight change.  HENT: Negative for hearing loss.   Eyes: Negative for visual disturbance.  Respiratory: Negative for cough, chest tightness,  shortness of breath and wheezing.   Cardiovascular: Negative for chest pain, palpitations and leg swelling.  Gastrointestinal: Negative for nausea, vomiting, abdominal pain, diarrhea, constipation, blood in stool and abdominal distention.  Genitourinary: Negative for hematuria and difficulty urinating.  Musculoskeletal: Negative for myalgias, arthralgias and neck pain.  Skin: Negative for rash.  Neurological: Negative for dizziness, seizures, syncope and headaches.  Hematological: Negative for adenopathy. Does not bruise/bleed easily.  Psychiatric/Behavioral: Negative for dysphoric mood. The patient is not nervous/anxious.    Per HPI unless specifically indicated in ROS section     Objective:    BP 130/84 mmHg  Pulse 56  Temp(Src) 98.2 F (36.8 C) (Oral)  Ht  (1.854 m)  Wt 242 lb 12 oz (110.111 kg)  BMI 32.03 kg/m2  Wt Readings from Last 3 Encounters:  06/07/15 242 lb 12 oz (110.111 kg)  05/11/14 239 lb 8 oz (108.636 kg)  05/04/14 237 lb (107.502 kg)    Physical Exam  Constitutional: He is oriented to person, place, and time. He appears well-developed and well-nourished. No distress.  HENT:  Head: Normocephalic and atraumatic.  Right Ear: Hearing, tympanic membrane, external ear and ear canal normal.  Left Ear: Hearing, tympanic membrane, external ear and ear canal normal.  Nose: Nose normal.  Mouth/Throat: Uvula is midline, oropharynx is clear and moist and mucous membranes are normal. No oropharyngeal exudate, posterior oropharyngeal edema or posterior oropharyngeal erythema.  Eyes: Conjunctivae and EOM are normal. Pupils are equal, round, and reactive to light.  No scleral icterus.  Neck: Normal range of motion. Neck supple. No thyromegaly present.  Cardiovascular: Normal rate, regular rhythm, normal heart sounds and intact distal pulses.   No murmur heard. Pulses:      Radial pulses are 2+ on the right side, and 2+ on the left side.  Pulmonary/Chest: Effort normal and  breath sounds normal. No respiratory distress. He has no wheezes. He has no rales.  Abdominal: Soft. Bowel sounds are normal. He exhibits no distension and no mass. There is no tenderness. There is no rebound and no guarding.  Musculoskeletal: Normal range of motion. He exhibits no edema.  Lymphadenopathy:    He has no cervical adenopathy.  Neurological: He is alert and oriented to person, place, and time.  CN grossly intact, station and gait intact  Skin: Skin is warm and dry. No rash noted.  Psychiatric: He has a normal mood and affect. His behavior is normal. Judgment and thought content normal.  Nursing note and vitals reviewed.  Results for orders placed or performed in visit on 06/03/15  Lipid panel  Result Value Ref Range   Cholesterol 203 (H) 0 - 200 mg/dL   Triglycerides 433.2 (H) 0.0 - 149.0 mg/dL   HDL 95.18 >84.16 mg/dL   VLDL 60.6 (H) 0.0 - 30.1 mg/dL   Total CHOL/HDL Ratio 5    NonHDL 163.42   Basic metabolic panel  Result Value Ref Range   Sodium 141 135 - 145 mEq/L   Potassium 4.5 3.5 - 5.1 mEq/L   Chloride 105 96 - 112 mEq/L   CO2 29 19 - 32 mEq/L   Glucose, Bld 109 (H) 70 - 99 mg/dL   BUN 12 6 - 23 mg/dL   Creatinine, Ser 6.01 0.40 - 1.50 mg/dL   Calcium 9.7 8.4 - 09.3 mg/dL   GFR 23.55 >73.22 mL/min  Hemoglobin A1c  Result Value Ref Range   Hgb A1c MFr Bld 5.7 4.6 - 6.5 %  PSA  Result Value Ref Range   PSA 0.44 0.10 - 4.00 ng/mL  LDL cholesterol, direct  Result Value Ref Range   Direct LDL 99.0 mg/dL      Assessment & Plan:   Problem List Items Addressed This Visit    HTN (hypertension)    Chronic, stable. Continue amlodipine 10mg  daily.      Relevant Medications   fenofibrate (TRICOR) 145 MG tablet   Chronic pain    Established with Dr Gretchen Short [facility].       Relevant Medications   buprenorphine-naloxone (SUBOXONE) 8-2 MG SUBL SL tablet   Obesity, Class I, BMI 30-34.9    Discussed healthy diet and lifestyle changes to affect  sustainable weight loss.      Dyslipidemia    Trig remain elevated despite lipitor 40mg  daily - will add tricor 145mg  daily. Check labs including LFTs in 6 months at f/u visit. Pt agrees with plan. Also discussed healthy diet changes to improve chol levels. Advised of need to decrease Dr Reino Kent intake      Relevant Medications   fenofibrate (TRICOR) 145 MG tablet   Prediabetes    Borderline prediabetic - discussed decreased dr pepper with patient.      Chronic insomnia    Chronic sleep maintenance > initiation insomnia. Requests we take over [place] and robaxin script Discussed sleep hygiene measures. Discussed possible dependence/habit forming nature of ambien.  Encouraged try to limit use. Provided with sleep hygiene measures handout.  Advised of need to decrease Dr Reino Kent  intake especially in evenings.      Health maintenance examination - Primary    Preventative protocols reviewed and updated unless pt declined. Discussed healthy diet and lifestyle.           Follow up plan: Return in about 6 months (around 12/08/2015), or as needed, for follow up visit.  Eustaquio BoydenJavier Angelus Hoopes, MD

## 2015-06-07 NOTE — Assessment & Plan Note (Signed)
Chronic, stable.  Continue amlodipine 10 mg daily 

## 2015-06-07 NOTE — Assessment & Plan Note (Signed)
Borderline prediabetic - discussed decreased dr pepper with patient.

## 2015-06-07 NOTE — Assessment & Plan Note (Signed)
Preventative protocols reviewed and updated unless pt declined. Discussed healthy diet and lifestyle.  

## 2015-06-07 NOTE — Assessment & Plan Note (Signed)
Established with Dr [facility]retchen [facility] [place] [facility].

## 2015-06-07 NOTE — Progress Notes (Signed)
Pre visit review using our clinic review tool, if applicable. No additional management support is needed unless otherwise documented below in the visit note. 

## 2015-06-07 NOTE — Patient Instructions (Addendum)
Triglycerides were too high. Decrease added sugars, eliminate trans fats, increase fiber and limit alcohol.  All these changes together can drop triglycerides by almost 50%.  Start fenofibrate (tricor) 145mg  daily along with lipitor 40mg  daily.  Return in 6 months for lipid check and follow up visit.  ambien refilled. Work on sleep hygiene: 1. Avoid naps during the day 2. Avoid stimulants such as caffeine and nicotine. Avoid bedtime alcohol (it can speed onset of sleep but the body's metabolism can cause awakenings). 3. All forms of exercise help ensure sound sleep - limit vigorous exercise to morning or late afternoon 4. Avoid food too close to bedtime including chocolate (which contains caffeine) 5. Soak up natural light 6. Establish regular bedtime routine. 7. Associate bed with sleep - avoid TV, computer or phone, reading while in bed. 8. Ensure pleasant, relaxing sleep environment - quiet, dark, cool room.  Health Maintenance, Male A healthy lifestyle and preventative care can promote health and wellness.  Maintain regular health, dental, and eye exams.  Eat a healthy diet. Foods like vegetables, fruits, whole grains, low-fat dairy products, and lean protein foods contain the nutrients you need and are low in calories. Decrease your intake of foods high in solid fats, added sugars, and salt. Get information about a proper diet from your health care provider, if necessary.  Regular physical exercise is one of the most important things you can do for your health. Most adults should get at least 150 minutes of moderate-intensity exercise (any activity that increases your heart rate and causes you to sweat) each week. In addition, most adults need muscle-strengthening exercises on 2 or more days a week.   Maintain a healthy weight. The body mass index (BMI) is a screening tool to identify possible weight problems. It provides an estimate of body fat based on height and weight. Your health  care provider can find your BMI and can help you achieve or maintain a healthy weight. For males 20 years and older:  A BMI below 18.5 is considered underweight.  A BMI of 18.5 to 24.9 is normal.  A BMI of 25 to 29.9 is considered overweight.  A BMI of 30 and above is considered obese.  Maintain normal blood lipids and cholesterol by exercising and minimizing your intake of saturated fat. Eat a balanced diet with plenty of fruits and vegetables. Blood tests for lipids and cholesterol should begin at age 24 and be repeated every 5 years. If your lipid or cholesterol levels are high, you are over age 85, or you are at high risk for heart disease, you may need your cholesterol levels checked more frequently.Ongoing high lipid and cholesterol levels should be treated with medicines if diet and exercise are not working.  If you smoke, find out from your health care provider how to quit. If you do not use tobacco, do not start.  Lung cancer screening is recommended for adults aged 55-80 years who are at high risk for developing lung cancer because of a history of smoking. A yearly low-dose CT scan of the lungs is recommended for people who have at least a 30-pack-year history of smoking and are current smokers or have quit within the past 15 years. A pack year of smoking is smoking an average of 1 pack of cigarettes a day for 1 year (for example, a 30-pack-year history of smoking could mean smoking 1 pack a day for 30 years or 2 packs a day for 15 years). Yearly screening should continue  until the smoker has stopped smoking for at least 15 years. Yearly screening should be stopped for people who develop a health problem that would prevent them from having lung cancer treatment.  If you choose to drink alcohol, do not have more than 2 drinks per day. One drink is considered to be 12 oz (360 mL) of beer, 5 oz (150 mL) of wine, or 1.5 oz (45 mL) of liquor.  Avoid the use of street drugs. Do not share  needles with anyone. Ask for help if you need support or instructions about stopping the use of drugs.  High blood pressure causes heart disease and increases the risk of stroke. High blood pressure is more likely to develop in:  People who have blood pressure in the end of the normal range (100-139/85-89 mm Hg).  People who are overweight or obese.  People who are African American.  If you are 6318-47 years of age, have your blood pressure checked every 3-5 years. If you are 47 years of age or older, have your blood pressure checked every year. You should have your blood pressure measured twice--once when you are at a hospital or clinic, and once when you are not at a hospital or clinic. Record the average of the two measurements. To check your blood pressure when you are not at a hospital or clinic, you can use:  An automated blood pressure machine at a pharmacy.  A home blood pressure monitor.  If you are 4945-47 years old, ask your health care provider if you should take aspirin to prevent heart disease.  Diabetes screening involves taking a blood sample to check your fasting blood sugar level. This should be done once every 3 years after age 47 if you are at a normal weight and without risk factors for diabetes. Testing should be considered at a younger age or be carried out more frequently if you are overweight and have at least 1 risk factor for diabetes.  Colorectal cancer can be detected and often prevented. Most routine colorectal cancer screening begins at the age of 47 and continues through age 47. However, your health care provider may recommend screening at an earlier age if you have risk factors for colon cancer. On a yearly basis, your health care provider may provide home test kits to check for hidden blood in the stool. A small camera at the end of a tube may be used to directly examine the colon (sigmoidoscopy or colonoscopy) to detect the earliest forms of colorectal cancer. Talk  to your health care provider about this at age 47 when routine screening begins. A direct exam of the colon should be repeated every 5-10 years through age 47, unless early forms of precancerous polyps or small growths are found.  People who are at an increased risk for hepatitis B should be screened for this virus. You are considered at high risk for hepatitis B if:  You were born in a country where hepatitis B occurs often. Talk with your health care provider about which countries are considered high risk.  Your parents were born in a high-risk country and you have not received a shot to protect against hepatitis B (hepatitis B vaccine).  You have HIV or AIDS.  You use needles to inject street drugs.  You live with, or have sex with, someone who has hepatitis B.  You are a man who has sex with other men (MSM).  You get hemodialysis treatment.  You take certain medicines for  conditions like cancer, organ transplantation, and autoimmune conditions.  Hepatitis C blood testing is recommended for all people born from [birth year] through 1967 and any individual with known risk factors for hepatitis C.  Healthy men should no longer receive prostate-specific antigen (PSA) blood tests as part of routine cancer screening. Talk to your health care provider about prostate cancer screening.  Testicular cancer screening is not recommended for adolescents or adult males who have no symptoms. Screening includes self-exam, a health care provider exam, and other screening tests. Consult with your health care provider about any symptoms you have or any concerns you have about testicular cancer.  Practice safe sex. Use condoms and avoid high-risk sexual practices to reduce the spread of sexually transmitted infections (STIs).  You should be screened for STIs, including gonorrhea and chlamydia if:  You are sexually active and are younger than 24 years.  You are older than 24 years, and your health care  provider tells you that you are at risk for this type of infection.  Your sexual activity has changed since you were last screened, and you are at an increased risk for chlamydia or gonorrhea. Ask your health care provider if you are at risk.  If you are at risk of being infected with HIV, it is recommended that you take a prescription medicine daily to prevent HIV infection. This is called pre-exposure prophylaxis (PrEP). You are considered at risk if:  You are a man who has sex with other men (MSM).  You are a heterosexual man who is sexually active with multiple partners.  You take drugs by injection.  You are sexually active with a partner who has HIV.  Talk with your health care provider about whether you are at high risk of being infected with HIV. If you choose to begin PrEP, you should first be tested for HIV. You should then be tested every 3 months for as long as you are taking PrEP.  Use sunscreen. Apply sunscreen liberally and repeatedly throughout the day. You should seek shade when your shadow is shorter than you. Protect yourself by wearing long sleeves, pants, a wide-brimmed hat, and sunglasses year round whenever you are outdoors.  Tell your health care provider of new moles or changes in moles, especially if there is a change in shape or color. Also, tell your health care provider if a mole is larger than the size of a pencil eraser.  A one-time screening for abdominal aortic aneurysm (AAA) and surgical repair of large AAAs by ultrasound is recommended for men aged 65-75 years who are current or former smokers.  Stay current with your vaccines (immunizations).   This information is not intended to replace advice given to you by your health care provider. Make sure you discuss any questions you have with your health care provider.   Document Released: 07/22/2007 Document Revised: 02/13/2014 Document Reviewed: 06/20/2010 Elsevier Interactive Patient Education [employer].

## 2015-06-07 NOTE — Assessment & Plan Note (Signed)
Discussed healthy diet and lifestyle changes to affect sustainable weight loss  

## 2015-06-07 NOTE — Assessment & Plan Note (Addendum)
Trig remain elevated despite lipitor 40mg  daily - will add tricor 145mg  daily. Check labs including LFTs in 6 months at f/u visit. Pt agrees with plan. Also discussed healthy diet changes to improve chol levels. Advised of need to decrease Dr Reino KentPepper intake

## 2015-06-07 NOTE — Assessment & Plan Note (Addendum)
Chronic sleep maintenance > initiation insomnia. [place] Discussed sleep hygiene measures. Discussed possible dependence/habit forming nature of ambien.  Encouraged try to limit use. Provided with sleep hygiene measures handout.  Advised of need to decrease Dr Reino KentPepper intake especially in evenings.

## 2015-06-29 DIAGNOSIS — F1120 Opioid dependence, uncomplicated: Secondary | ICD-10-CM | POA: Diagnosis not present

## 2015-06-29 DIAGNOSIS — Z79899 Other long term (current) drug therapy: Secondary | ICD-10-CM | POA: Diagnosis not present

## 2015-07-05 ENCOUNTER — Other Ambulatory Visit: Payer: Self-pay | Admitting: Family Medicine

## 2015-07-06 NOTE — Telephone Encounter (Signed)
Received refill electronically Last refill 06/07/15 #30 Last office visit same date

## 2015-07-07 ENCOUNTER — Telehealth: Payer: Self-pay | Admitting: *Deleted

## 2015-07-07 NOTE — Telephone Encounter (Signed)
PA required for ambien. Submitted through Cover My Meds. Will await determination.

## 2015-07-07 NOTE — Telephone Encounter (Signed)
Rx called in as directed.   

## 2015-07-07 NOTE — Telephone Encounter (Signed)
plz phone in. 

## 2015-07-07 NOTE — Telephone Encounter (Signed)
PA approved. Pharmacy notified 

## 2015-08-04 ENCOUNTER — Other Ambulatory Visit: Payer: Self-pay | Admitting: *Deleted

## 2015-08-04 NOTE — Telephone Encounter (Signed)
Ok to refill 

## 2015-08-05 MED ORDER — ZOLPIDEM TARTRATE 10 MG PO TABS: 10.0000 mg | ORAL_TABLET | Freq: Every evening | ORAL | Status: DC | PRN

## 2015-08-05 NOTE — Telephone Encounter (Signed)
plz phone in. 

## 2015-08-05 NOTE — Telephone Encounter (Signed)
[facility]ed [facility].  

## 2015-08-26 ENCOUNTER — Other Ambulatory Visit: Payer: Self-pay | Admitting: Family Medicine

## 2015-08-27 ENCOUNTER — Telehealth: Payer: Self-pay | Admitting: *Deleted

## 2015-08-27 MED ORDER — ZOLPIDEM TARTRATE 10 MG PO TABS: 10.0000 mg | ORAL_TABLET | Freq: Every evening | ORAL | Status: DC | PRN

## 2015-08-27 NOTE — Telephone Encounter (Signed)
Ok to refill 

## 2015-08-27 NOTE — Telephone Encounter (Signed)
Rx called in as directed.   

## 2015-08-27 NOTE — Telephone Encounter (Signed)
plz phone in. 

## 2015-08-31 ENCOUNTER — Other Ambulatory Visit: Payer: Self-pay | Admitting: *Deleted

## 2015-08-31 MED ORDER — FENOFIBRATE 145 MG PO TABS: 145.0000 mg | ORAL_TABLET | Freq: Every day | ORAL | 1 refills | Status: DC

## 2015-08-31 MED ORDER — ATORVASTATIN CALCIUM 40 MG PO TABS
40.0000 mg | ORAL_TABLET | Freq: Every day | ORAL | 1 refills | Status: DC
Start: 2015-08-31 — End: 2016-02-16

## 2015-08-31 MED ORDER — METHOCARBAMOL 750 MG PO TABS: ORAL_TABLET | ORAL | 3 refills | Status: DC

## 2015-08-31 NOTE — Telephone Encounter (Signed)
Pt left v/m requesting status of ambien refill to [place].

## 2015-08-31 NOTE — Telephone Encounter (Signed)
Rx called in again. Not received by pharmacy.

## 2015-08-31 NOTE — Telephone Encounter (Signed)
Ok to refill 

## 2015-08-31 NOTE — Telephone Encounter (Signed)
Looks like it was phoned in 4d ago?

## 2015-10-04 ENCOUNTER — Other Ambulatory Visit: Payer: Self-pay | Admitting: *Deleted

## 2015-10-04 NOTE — Telephone Encounter (Signed)
Ok to refill? Last filled on 09/03/15 #30 0RF

## 2015-10-06 MED ORDER — ZOLPIDEM TARTRATE 10 MG PO TABS: 10.0000 mg | ORAL_TABLET | Freq: Every evening | ORAL | 0 refills | Status: DC | PRN

## 2015-10-06 NOTE — Telephone Encounter (Signed)
Rx called in as directed.   

## 2015-10-06 NOTE — Telephone Encounter (Signed)
plz phoen in. 

## 2015-11-02 ENCOUNTER — Other Ambulatory Visit: Payer: Self-pay | Admitting: *Deleted

## 2015-11-02 NOTE — Telephone Encounter (Signed)
Ok to refill? Last filled 10/06/15 #30 0RF 

## 2015-11-04 MED ORDER — ZOLPIDEM TARTRATE 10 MG PO TABS: 10.0000 mg | ORAL_TABLET | Freq: Every evening | ORAL | 0 refills | Status: DC | PRN

## 2015-11-04 NOTE — Telephone Encounter (Signed)
Rx called in as directed.   

## 2015-11-04 NOTE — Telephone Encounter (Signed)
plz phone in. 

## 2015-12-01 ENCOUNTER — Other Ambulatory Visit: Payer: Self-pay

## 2015-12-01 MED ORDER — ZOLPIDEM TARTRATE 10 MG PO TABS: 10.0000 mg | ORAL_TABLET | Freq: Every evening | ORAL | 0 refills | Status: DC | PRN

## 2015-12-01 NOTE — Telephone Encounter (Signed)
Last refill 11/04/15 #30, last OV 06/07/15. Ok to refill?

## 2015-12-01 NOTE — Telephone Encounter (Signed)
Called in to [facility] - [place], [place] - [address]: [phone]  

## 2015-12-01 NOTE — Telephone Encounter (Signed)
plz phone in. 

## 2015-12-09 ENCOUNTER — Ambulatory Visit: Payer: Federal, State, Local not specified - PPO | Admitting: Family Medicine

## 2015-12-15 ENCOUNTER — Encounter: Payer: Self-pay | Admitting: Family Medicine

## 2015-12-15 ENCOUNTER — Ambulatory Visit (INDEPENDENT_AMBULATORY_CARE_PROVIDER_SITE_OTHER): Payer: Federal, State, Local not specified - PPO | Admitting: Family Medicine

## 2015-12-15 VITALS — BP 130/84 | HR 64 | Temp 97.9°F | Wt 249.5 lb

## 2015-12-15 DIAGNOSIS — E785 Hyperlipidemia, unspecified: Secondary | ICD-10-CM | POA: Diagnosis not present

## 2015-12-15 DIAGNOSIS — Z23 Encounter for immunization: Secondary | ICD-10-CM | POA: Diagnosis not present

## 2015-12-15 DIAGNOSIS — R7303 Prediabetes: Secondary | ICD-10-CM

## 2015-12-15 DIAGNOSIS — E669 Obesity, unspecified: Secondary | ICD-10-CM | POA: Diagnosis not present

## 2015-12-15 DIAGNOSIS — I10 Essential (primary) hypertension: Secondary | ICD-10-CM | POA: Diagnosis not present

## 2015-12-15 DIAGNOSIS — F5104 Psychophysiologic insomnia: Secondary | ICD-10-CM

## 2015-12-15 LAB — LIPID PANEL
CHOLESTEROL: 211 mg/dL — AB (ref 0–200)
HDL: 36.5 mg/dL — AB (ref >39.00)
NonHDL: 174.76
TRIGLYCERIDES: 219 mg/dL — AB (ref 0.0–149.0)
Total CHOL/HDL Ratio: 6
VLDL: 43.8 mg/dL — ABNORMAL HIGH (ref 0.0–40.0)

## 2015-12-15 LAB — COMPREHENSIVE METABOLIC PANEL
ALBUMIN: 4.8 g/dL (ref 3.5–5.2)
ALK PHOS: 46 U/L (ref 39–117)
ALT: 35 U/L (ref 0–53)
AST: 27 U/L (ref 0–37)
BUN: 17 mg/dL (ref 6–23)
CALCIUM: 9.8 mg/dL (ref 8.4–10.5)
CO2: 28 mEq/L (ref 19–32)
Chloride: 104 mEq/L (ref 96–112)
Creatinine, Ser: 1.35 mg/dL (ref 0.40–1.50)
GFR: 60.19 mL/min (ref >60.00)
Glucose, Bld: 102 mg/dL — ABNORMAL HIGH (ref 70–99)
POTASSIUM: 4.5 meq/L (ref 3.5–5.1)
Sodium: 139 mEq/L (ref 135–145)
TOTAL PROTEIN: 7.8 g/dL (ref 6.0–8.3)
Total Bilirubin: 0.4 mg/dL (ref 0.2–1.2)

## 2015-12-15 LAB — LDL CHOLESTEROL, DIRECT: LDL DIRECT: 144 mg/dL

## 2015-12-15 LAB — HEMOGLOBIN A1C: HEMOGLOBIN A1C: 5.7 % (ref 4.6–6.5)

## 2015-12-15 NOTE — Assessment & Plan Note (Signed)
Update A1c. Encouraged continued decreased Dr Reino KentPepper

## 2015-12-15 NOTE — Assessment & Plan Note (Signed)
Chronic, stable. Reports compliance with fibrate and statin. Update FLP today

## 2015-12-15 NOTE — Assessment & Plan Note (Signed)
Chronic, stable. Continue current regimen. 

## 2015-12-15 NOTE — Progress Notes (Signed)
Pre visit review using our clinic review tool, if applicable. No additional management support is needed unless otherwise documented below in the visit note. 

## 2015-12-15 NOTE — Patient Instructions (Addendum)
Flu shot today. Labs today.  Think about winter exercise routine - like using treadmill several times a week.  Return in 6 months for physical.

## 2015-12-15 NOTE — Progress Notes (Signed)
BP 130/84   Pulse 64   Temp 97.9 F (36.6 C) (Oral)   Wt 249 lb 8 oz (113.2 kg)   BMI 32.92 kg/m   CC: 53mo f/u visit Subjective:    Patient ID: William Friedman, male    DOB: 06-Feb-1971, 45 y.o.   MRN: [id number]  HPI: William Friedman is a 45 y.o. male presenting on 12/15/2015 for Follow-up   Chronic pain - sees Dr Forrestine HimButtar at [facility] and on suboxone.   Chronic insomnia - prescribed ambien 10mg  QHS by our office. More stress at home - MIL moved in with them - she has pancreatic cancer is not doing well.   Obesity - no regular exercise, but he does walk 9 holes for golf a few times a week.  HTN - Compliant with current antihypertensive regimen of amlodipine 10mg  daily. Does not regularly check blood pressures at home. No low blood pressure readings or symptoms of dizziness/syncope. Denies HA, vision changes, CP/tightness, SOB, leg swelling.   HLD - on lipitor 40mg  daily and latest addition of tricor 145mg  daily.   Relevant past medical, surgical, family and social history reviewed and updated as indicated. Interim medical history since our last visit reviewed. Allergies and medications reviewed and updated. Current Outpatient Prescriptions on File Prior to Visit  Medication Sig  . amLODipine (NORVASC) 10 MG tablet TAKE 1 TABLET BY MOUTH DAILY  . aspirin EC 81 MG tablet Take 81 mg by mouth every other day.  Marland Kitchen. atorvastatin (LIPITOR) 40 MG tablet Take 1 tablet (40 mg total) by mouth daily.  . buprenorphine-naloxone (SUBOXONE) 8-2 MG SUBL SL tablet Place 1 tablet under the tongue 2 (two) times daily.  . fenofibrate (TRICOR) 145 MG tablet Take 1 tablet (145 mg total) by mouth daily.  . methocarbamol (ROBAXIN) 750 MG tablet TAKE 1 TABLET BY MOUTH 3 TIMES A DAY AS NEEDED FOR MUSCLE SPASMS.  . Multiple Vitamins-Minerals (MULTIVITAMIN PO) Take 1 tablet by mouth daily.  Marland Kitchen. zolpidem (AMBIEN) 10 MG tablet Take 1 tablet (10 mg total) by mouth at bedtime as needed.   No  current facility-administered medications on file prior to visit.     Review of Systems Per HPI unless specifically indicated in ROS section     Objective:    BP 130/84   Pulse 64   Temp 97.9 F (36.6 C) (Oral)   Wt 249 lb 8 oz (113.2 kg)   BMI 32.92 kg/m   Wt Readings from Last 3 Encounters:  12/15/15 249 lb 8 oz (113.2 kg)  06/07/15 242 lb 12 oz (110.1 kg)  05/11/14 239 lb 8 oz (108.6 kg)    Physical Exam  Constitutional: He appears well-developed and well-nourished. No distress.  HENT:  Mouth/Throat: Oropharynx is clear and moist. No oropharyngeal exudate.  Neck: No thyromegaly present.  Cardiovascular: Normal rate, regular rhythm, normal heart sounds and intact distal pulses.   No murmur heard. Pulmonary/Chest: Effort normal and breath sounds normal. No respiratory distress. He has no wheezes. He has no rales.  Skin: Skin is warm and dry.  Psychiatric: He has a normal mood and affect. His behavior is normal. Thought content normal.  Nursing note and vitals reviewed.  Results for orders placed or performed in visit on 06/03/15  Lipid panel  Result Value Ref Range   Cholesterol 203 (H) 0 - 200 mg/dL   Triglycerides 409.8311.0 (H) 0.0 - 149.0 mg/dL   HDL 11.9139.70 >47.82>39.00 mg/dL   VLDL 95.662.2 (H) 0.0 -  40.0 mg/dL   Total CHOL/HDL Ratio 5    NonHDL 163.42   Basic metabolic panel  Result Value Ref Range   Sodium 141 135 - 145 mEq/L   Potassium 4.5 3.5 - 5.1 mEq/L   Chloride 105 96 - 112 mEq/L   CO2 29 19 - 32 mEq/L   Glucose, Bld 109 (H) 70 - 99 mg/dL   BUN 12 6 - 23 mg/dL   Creatinine, Ser 1.611.03 0.40 - 1.50 mg/dL   Calcium 9.7 8.4 - 09.610.5 mg/dL   GFR 04.5482.44 >09.81>60.00 mL/min  Hemoglobin A1c  Result Value Ref Range   Hgb A1c MFr Bld 5.7 4.6 - 6.5 %  PSA  Result Value Ref Range   PSA 0.44 0.10 - 4.00 ng/mL  LDL cholesterol, direct  Result Value Ref Range   Direct LDL 99.0 mg/dL      Assessment & Plan:   Problem List Items Addressed This Visit    Chronic insomnia     Chronic. Reviewed sleep hygiene. Continue ambien at this time.       Dyslipidemia    Chronic, stable. Reports compliance with fibrate and statin. Update FLP today      Relevant Orders   Lipid panel   Comprehensive metabolic panel   Hemoglobin A1c   HTN (hypertension) - Primary    Chronic, stable. Continue current regimen.       Obesity, Class I, BMI 30-34.9    Discussed healthy diet and lifestyle changes to affect sustainable weight loss. Reviewed exercise regimen during winter season.      Prediabetes    Update A1c. Encouraged continued decreased Dr Reino KentPepper          Follow up plan: Return in about 6 months (around 06/13/2016) for annual exam, prior fasting for blood work.  Eustaquio BoydenJavier Jurell Basista, MD

## 2015-12-15 NOTE — Assessment & Plan Note (Signed)
Chronic. Reviewed sleep hygiene. Continue ambien at this time.

## 2015-12-15 NOTE — Assessment & Plan Note (Addendum)
Discussed healthy diet and lifestyle changes to affect sustainable weight loss. Reviewed exercise regimen during winter season.

## 2015-12-15 NOTE — Addendum Note (Signed)
Addended by: Josph MachoANCE, KIMBERLY A on: 12/15/2015 10:47 AM   Modules accepted: Orders

## 2015-12-16 ENCOUNTER — Encounter: Payer: Self-pay | Admitting: *Deleted

## 2015-12-19 ENCOUNTER — Other Ambulatory Visit: Payer: Self-pay | Admitting: Family Medicine

## 2016-01-03 ENCOUNTER — Other Ambulatory Visit: Payer: Self-pay | Admitting: *Deleted

## 2016-01-03 NOTE — Telephone Encounter (Signed)
Received faxed refill request from pharmacy for Zolpidem Last refill 12/01/15 #30 Last office visit 12/15/15

## 2016-01-04 MED ORDER — ZOLPIDEM TARTRATE 10 MG PO TABS: 10.0000 mg | ORAL_TABLET | Freq: Every evening | ORAL | 0 refills | Status: DC | PRN

## 2016-01-04 NOTE — Telephone Encounter (Signed)
plz phone in. 

## 2016-01-05 NOTE — Telephone Encounter (Signed)
Rx called in as directed.   

## 2016-01-26 ENCOUNTER — Other Ambulatory Visit: Payer: Self-pay | Admitting: Family Medicine

## 2016-01-26 NOTE — Telephone Encounter (Signed)
Pt left v/m checking on status of refill for ambien; pt is requesting early because pt is going out of town next week and wants to get med before goes out of town. Pt request [facility]. Pt last seen 12/15/15.

## 2016-01-26 NOTE — Telephone Encounter (Signed)
plz phone in. 

## 2016-01-26 NOTE — Telephone Encounter (Signed)
Ok to refill? Last filled 01/04/16 #30 0RF

## 2016-01-26 NOTE — Telephone Encounter (Signed)
Rx called in as directed.   

## 2016-02-16 ENCOUNTER — Other Ambulatory Visit: Payer: Self-pay | Admitting: Family Medicine

## 2016-02-27 ENCOUNTER — Other Ambulatory Visit: Payer: Self-pay | Admitting: Family Medicine

## 2016-02-28 NOTE — Telephone Encounter (Signed)
Rx called in as directed.   

## 2016-02-28 NOTE — Telephone Encounter (Signed)
plz phone in. 

## 2016-03-26 ENCOUNTER — Other Ambulatory Visit: Payer: Self-pay | Admitting: Family Medicine

## 2016-03-27 NOTE — Telephone Encounter (Signed)
Rx phoned in to the pts pharmacy.

## 2016-03-27 NOTE — Telephone Encounter (Signed)
plz phone in. 

## 2016-04-13 ENCOUNTER — Other Ambulatory Visit: Payer: Self-pay | Admitting: Family Medicine

## 2016-04-13 NOTE — Telephone Encounter (Signed)
Ok to refill in Dr. Timoteo ExposeG's absence? Last filled 12/20/15 #60 3RF

## 2016-04-14 NOTE — Telephone Encounter (Signed)
Sent. Thanks.   

## 2016-05-12 ENCOUNTER — Other Ambulatory Visit: Payer: Self-pay | Admitting: Family Medicine

## 2016-05-13 NOTE — Telephone Encounter (Signed)
Last office visit 12/15/15.  Last refilled 04/14/16 for #60 with no refills.  Ok to refill?

## 2016-05-16 DIAGNOSIS — F1120 Opioid dependence, uncomplicated: Secondary | ICD-10-CM | POA: Diagnosis not present

## 2016-05-16 DIAGNOSIS — Z0289 Encounter for other administrative examinations: Secondary | ICD-10-CM | POA: Diagnosis not present

## 2016-05-16 DIAGNOSIS — Z79899 Other long term (current) drug therapy: Secondary | ICD-10-CM | POA: Diagnosis not present

## 2016-05-19 ENCOUNTER — Other Ambulatory Visit: Payer: Self-pay | Admitting: Family Medicine

## 2016-05-19 NOTE — Telephone Encounter (Signed)
Plz phone in

## 2016-05-22 NOTE — Telephone Encounter (Signed)
Rx called in as directed.   

## 2016-05-31 DIAGNOSIS — M47812 Spondylosis without myelopathy or radiculopathy, cervical region: Secondary | ICD-10-CM | POA: Diagnosis not present

## 2016-05-31 DIAGNOSIS — M47816 Spondylosis without myelopathy or radiculopathy, lumbar region: Secondary | ICD-10-CM | POA: Diagnosis not present

## 2016-05-31 DIAGNOSIS — M542 Cervicalgia: Secondary | ICD-10-CM | POA: Diagnosis not present

## 2016-05-31 DIAGNOSIS — M545 Low back pain: Secondary | ICD-10-CM | POA: Diagnosis not present

## 2016-06-07 ENCOUNTER — Ambulatory Visit (INDEPENDENT_AMBULATORY_CARE_PROVIDER_SITE_OTHER): Payer: Federal, State, Local not specified - PPO | Admitting: Family Medicine

## 2016-06-07 ENCOUNTER — Encounter: Payer: Self-pay | Admitting: Family Medicine

## 2016-06-07 VITALS — BP 132/70 | HR 71 | Temp 98.0°F | Ht 73.0 in | Wt 243.0 lb

## 2016-06-07 DIAGNOSIS — E669 Obesity, unspecified: Secondary | ICD-10-CM

## 2016-06-07 DIAGNOSIS — I10 Essential (primary) hypertension: Secondary | ICD-10-CM

## 2016-06-07 DIAGNOSIS — F5104 Psychophysiologic insomnia: Secondary | ICD-10-CM | POA: Diagnosis not present

## 2016-06-07 DIAGNOSIS — G894 Chronic pain syndrome: Secondary | ICD-10-CM

## 2016-06-07 DIAGNOSIS — E785 Hyperlipidemia, unspecified: Secondary | ICD-10-CM | POA: Diagnosis not present

## 2016-06-07 DIAGNOSIS — Z0000 Encounter for general adult medical examination without abnormal findings: Secondary | ICD-10-CM

## 2016-06-07 DIAGNOSIS — R7303 Prediabetes: Secondary | ICD-10-CM

## 2016-06-07 LAB — COMPREHENSIVE METABOLIC PANEL
ALBUMIN: 4.7 g/dL (ref 3.5–5.2)
ALK PHOS: 48 U/L (ref 39–117)
ALT: 28 U/L (ref 0–53)
AST: 27 U/L (ref 0–37)
BUN: 19 mg/dL (ref 6–23)
CALCIUM: 9.7 mg/dL (ref 8.4–10.5)
CO2: 26 meq/L (ref 19–32)
Chloride: 105 mEq/L (ref 96–112)
Creatinine, Ser: 1.32 mg/dL (ref 0.40–1.50)
GFR: 61.65 mL/min (ref >60.00)
Glucose, Bld: 105 mg/dL — ABNORMAL HIGH (ref 70–99)
Potassium: 4.5 mEq/L (ref 3.5–5.1)
Sodium: 138 mEq/L (ref 135–145)
Total Bilirubin: 0.4 mg/dL (ref 0.2–1.2)
Total Protein: 7.7 g/dL (ref 6.0–8.3)

## 2016-06-07 LAB — LIPID PANEL
CHOLESTEROL: 198 mg/dL (ref 0–200)
HDL: 43.7 mg/dL (ref >39.00)
LDL CALC: 119 mg/dL — AB (ref 0–99)
NonHDL: 154.31
TRIGLYCERIDES: 179 mg/dL — AB (ref 0.0–149.0)
Total CHOL/HDL Ratio: 5
VLDL: 35.8 mg/dL (ref 0.0–40.0)

## 2016-06-07 NOTE — Assessment & Plan Note (Signed)
rec avoiding added sugars and sweetened beverages - rec work towards less sodas

## 2016-06-07 NOTE — Assessment & Plan Note (Signed)
Chronic. Continue fibrate and statin. Update labs today.

## 2016-06-07 NOTE — Assessment & Plan Note (Signed)
Discussed healthy diet and lifestyle changes to affect sustainable weight loss  

## 2016-06-07 NOTE — Progress Notes (Signed)
BP 132/70   Pulse 71   Temp 98 F (36.7 C)   Ht  (1.854 m)   Wt 243 lb (110.2 kg)   SpO2 98%   BMI 32.06 kg/m    CC: CPE Subjective:    Patient ID: William Friedman, male    DOB: Dec 10, 1970, 46 y.o.   MRN: [id number]  HPI: William Friedman is a 46 y.o. male presenting on 06/07/2016 for Annual Exam   Chronic pain - Sees Dr Forrestine Him in [facility]. Suboxone started 02/2015. Also on methocarbamole  1-2 daily, as well as ambien nightly for sleep.   Chronic insomnia - maintenance > initiation. Treated with ambien longterm and tolerating well.   Preventative: Flu shot - did not receive Tetanus unsure ~2012  Seat belt use discussed Sunscreen use discussed. No changing moles on skin.  Non smoker Alcohol - rare  Caffeine: sodas Dr Reino Kent - 1L/day  Lives with wife and daughter, 1 dog  Occupation: special agent - [employer]  Edu: BS  Activity: golfing.  Diet: poor - not regular fruits/vegetables, not regular water. Lots of soda.   Relevant past medical, surgical, family and social history reviewed and updated as indicated. Interim medical history since our last visit reviewed. Allergies and medications reviewed and updated. Outpatient Medications Prior to Visit  Medication Sig Dispense Refill  . amLODipine (NORVASC) 10 MG tablet TAKE 1 TABLET BY MOUTH DAILY 90 tablet 3  . aspirin EC 81 MG tablet Take 81 mg by mouth every other day.    Marland Kitchen atorvastatin (LIPITOR) 40 MG tablet TAKE 1 TABLET BY MOUTH DAILY 90 tablet 2  . buprenorphine-naloxone (SUBOXONE) 8-2 MG SUBL SL tablet Place 1 tablet under the tongue 2 (two) times daily.    . fenofibrate (TRICOR) 145 MG tablet Take 1 tablet (145 mg total) by mouth daily. 90 tablet 1  . methocarbamol (ROBAXIN) 750 MG tablet TAKE 1 TABLET BY MOUTH 3 TIMES A DAY AS NEEDED FOR MUSCLE SPASMS. 60 tablet 3  . Multiple Vitamins-Minerals (MULTIVITAMIN PO) Take 1 tablet by mouth daily.    Marland Kitchen zolpidem (AMBIEN) 10 MG tablet TAKE ONE  TABLET BY MOUTH EVERY NIGHT AT BEDTIME 30 tablet 0   No facility-administered medications prior to visit.      Per HPI unless specifically indicated in ROS section below Review of Systems  Constitutional: Negative for activity change, appetite change, chills, fatigue, fever and unexpected weight change.  HENT: Negative for hearing loss.   Eyes: Negative for visual disturbance.  Respiratory: Negative for cough, chest tightness, shortness of breath and wheezing.   Cardiovascular: Negative for chest pain, palpitations and leg swelling.  Gastrointestinal: Negative for abdominal distention, abdominal pain, blood in stool, constipation, diarrhea, nausea and vomiting.  Genitourinary: Negative for difficulty urinating and hematuria.  Musculoskeletal: Negative for arthralgias, myalgias and neck pain.  Skin: Negative for rash.  Neurological: Negative for dizziness, seizures, syncope and headaches.  Hematological: Negative for adenopathy. Does not bruise/bleed easily.  Psychiatric/Behavioral: Negative for dysphoric mood. The patient is not nervous/anxious.        Objective:    BP 132/70   Pulse 71   Temp 98 F (36.7 C)   Ht  (1.854 m)   Wt 243 lb (110.2 kg)   SpO2 98%   BMI 32.06 kg/m   Wt Readings from Last 3 Encounters:  06/07/16 243 lb (110.2 kg)  12/15/15 249 lb 8 oz (113.2 kg)  06/07/15 242 lb 12 oz (110.1 kg)  Physical Exam  Constitutional: He is oriented to person, place, and time. He appears well-developed and well-nourished. No distress.  HENT:  Head: Normocephalic and atraumatic.  Right Ear: Hearing, tympanic membrane, external ear and ear canal normal.  Left Ear: Hearing, tympanic membrane, external ear and ear canal normal.  Nose: Nose normal.  Mouth/Throat: Uvula is midline, oropharynx is clear and moist and mucous membranes are normal. No oropharyngeal exudate, posterior oropharyngeal edema or posterior oropharyngeal erythema.  Eyes: Conjunctivae and EOM are  normal. Pupils are equal, round, and reactive to light. No scleral icterus.  Neck: Normal range of motion. Neck supple. No thyromegaly present.  Cardiovascular: Normal rate, regular rhythm, normal heart sounds and intact distal pulses.   No murmur heard. Pulses:      Radial pulses are 2+ on the right side, and 2+ on the left side.  Pulmonary/Chest: Effort normal and breath sounds normal. No respiratory distress. He has no wheezes. He has no rales.  Abdominal: Soft. Bowel sounds are normal. He exhibits no distension and no mass. There is no tenderness. There is no rebound and no guarding.  Musculoskeletal: Normal range of motion. He exhibits no edema.  Lymphadenopathy:    He has no cervical adenopathy.  Neurological: He is alert and oriented to person, place, and time.  CN grossly intact, station and gait intact  Skin: Skin is warm and dry. No rash noted.  Psychiatric: He has a normal mood and affect. His behavior is normal. Judgment and thought content normal.  Nursing note and vitals reviewed.  Results for orders placed or performed in visit on 12/15/15  Lipid panel  Result Value Ref Range   Cholesterol 211 (H) 0 - 200 mg/dL   Triglycerides 308.6 (H) 0.0 - 149.0 mg/dL   HDL 57.84 (L) >69.62 mg/dL   VLDL 95.2 (H) 0.0 - 84.1 mg/dL   Total CHOL/HDL Ratio 6    NonHDL 174.76   Comprehensive metabolic panel  Result Value Ref Range   Sodium 139 135 - 145 mEq/L   Potassium 4.5 3.5 - 5.1 mEq/L   Chloride 104 96 - 112 mEq/L   CO2 28 19 - 32 mEq/L   Glucose, Bld 102 (H) 70 - 99 mg/dL   BUN 17 6 - 23 mg/dL   Creatinine, Ser 3.24 0.40 - 1.50 mg/dL   Total Bilirubin 0.4 0.2 - 1.2 mg/dL   Alkaline Phosphatase 46 39 - 117 U/L   AST 27 0 - 37 U/L   ALT 35 0 - 53 U/L   Total Protein 7.8 6.0 - 8.3 g/dL   Albumin 4.8 3.5 - 5.2 g/dL   Calcium 9.8 8.4 - 40.1 mg/dL   GFR 02.72 >53.66 mL/min  Hemoglobin A1c  Result Value Ref Range   Hgb A1c MFr Bld 5.7 4.6 - 6.5 %  LDL cholesterol, direct    Result Value Ref Range   Direct LDL 144.0 mg/dL      Assessment & Plan:   Problem List Items Addressed This Visit    Chronic insomnia    Reviewed Remus Loffler use, possible side effects, and concerns with long-term use. He has not tried other medications. rec trial  ambien with goal to taper off med, suggested trial benadryl or melatonin for sleep. RTC 6 mo insomnia f/u. Would consider other treatments in place of ambien.  We have previously reviewed sleep hygiene.       Chronic pain    Followed by Dr Forrestine Him, on suboxone therapy.  Dyslipidemia    Chronic. Continue fibrate and statin. Update labs today.       Relevant Orders   Lipid panel   Comprehensive metabolic panel   Health maintenance examination - Primary    Preventative protocols reviewed and updated unless pt declined. Discussed healthy diet and lifestyle.       HTN (hypertension)    Chronic, stable. Continue current regimen.       Obesity, Class I, BMI 30-34.9    Discussed healthy diet and lifestyle changes to affect sustainable weight loss.      Prediabetes    rec avoiding added sugars and sweetened beverages - rec work towards less sodas           Follow up plan: Return in about 6 months (around 12/08/2016) for follow up visit.  Eustaquio Boyden, MD

## 2016-06-07 NOTE — Progress Notes (Signed)
Pre visit review using our clinic review tool, if applicable. No additional management support is needed unless otherwise documented below in the visit note. 

## 2016-06-07 NOTE — Patient Instructions (Addendum)
Try slow taper off ambien - start at 1/2 tablet night to see if effective (goal lower dose). If doing well, could try benadryl  or melatonin 6-10mg  at bedtime as needed for sleep - see if we do ok with these, and if so, take in place of ambien. Labs today.  You are doing well today. Return as needed or in 6 months for follow up visit.   Health Maintenance, Male A healthy lifestyle and preventive care is important for your health and wellness. Ask your health care provider about what schedule of regular examinations is right for you. What should I know about weight and diet?  Eat a Healthy Diet  Eat plenty of vegetables, fruits, whole grains, low-fat dairy products, and lean protein.  Do not eat a lot of foods high in solid fats, added sugars, or salt. Maintain a Healthy Weight  Regular exercise can help you achieve or maintain a healthy weight. You should:  Do at least 150 minutes of exercise each week. The exercise should increase your heart rate and make you sweat (moderate-intensity exercise).  Do strength-training exercises at least twice a week. Watch Your Levels of Cholesterol and Blood Lipids  Have your blood tested for lipids and cholesterol every 5 years starting at 48 years of age. If you are at high risk for heart disease, you should start having your blood tested when you are 48 years old. You may need to have your cholesterol levels checked more often if:  Your lipid or cholesterol levels are high.  You are older than 48 years of age.  You are at high risk for heart disease. What should I know about cancer screening? Many types of cancers can be detected early and may often be prevented. Lung Cancer  You should be screened every year for lung cancer if:  You are a current smoker who has smoked for at least 30 years.  You are a former smoker who has quit within the past 15 years.  Talk to your health care provider about your screening options, when you should  start screening, and how often you should be screened. Colorectal Cancer  Routine colorectal cancer screening usually begins at 48 years of age and should be repeated every 5-10 years until you are 48 years old. You may need to be screened more often if early forms of precancerous polyps or small growths are found. Your health care provider may recommend screening at an earlier age if you have risk factors for colon cancer.  Your health care provider may recommend using home test kits to check for hidden blood in the stool.  A small camera at the end of a tube can be used to examine your colon (sigmoidoscopy or colonoscopy). This checks for the earliest forms of colorectal cancer. Prostate and Testicular Cancer  Depending on your age and overall health, your health care provider may do certain tests to screen for prostate and testicular cancer.  Talk to your health care provider about any symptoms or concerns you have about testicular or prostate cancer. Skin Cancer  Check your skin from head to toe regularly.  Tell your health care provider about any new moles or changes in moles, especially if:  There is a change in a mole's size, shape, or color.  You have a mole that is larger than a pencil eraser.  Always use sunscreen. Apply sunscreen liberally and repeat throughout the day.  Protect yourself by wearing long sleeves, pants, a wide-brimmed hat, and  sunglasses when outside. What should I know about heart disease, diabetes, and high blood pressure?  If you are 82-26 years of age, have your blood pressure checked every 3-5 years. If you are 14 years of age or older, have your blood pressure checked every year. You should have your blood pressure measured twice-once when you are at a hospital or clinic, and once when you are not at a hospital or clinic. Record the average of the two measurements. To check your blood pressure when you are not at a hospital or clinic, you can use:  An  automated blood pressure machine at a pharmacy.  A home blood pressure monitor.  Talk to your health care provider about your target blood pressure.  If you are between 49-78 years old, ask your health care provider if you should take aspirin to prevent heart disease.  Have regular diabetes screenings by checking your fasting blood sugar level.  If you are at a normal weight and have a low risk for diabetes, have this test once every three years after the age of 59.  If you are overweight and have a high risk for diabetes, consider being tested at a younger age or more often.  A one-time screening for abdominal aortic aneurysm (AAA) by ultrasound is recommended for men aged 65-75 years who are current or former smokers. What should I know about preventing infection? Hepatitis B  If you have a higher risk for hepatitis B, you should be screened for this virus. Talk with your health care provider to find out if you are at risk for hepatitis B infection. Hepatitis C  Blood testing is recommended for:  Everyone born from [birth year] through 1967.  Anyone with known risk factors for hepatitis C. Sexually Transmitted Diseases (STDs)  You should be screened each year for STDs including gonorrhea and chlamydia if:  You are sexually active and are younger than 48 years of age.  You are older than 48 years of age and your health care provider tells you that you are at risk for this type of infection.  Your sexual activity has changed since you were last screened and you are at an increased risk for chlamydia or gonorrhea. Ask your health care provider if you are at risk.  Talk with your health care provider about whether you are at high risk of being infected with HIV. Your health care provider may recommend a prescription medicine to help prevent HIV infection. What else can I do?  Schedule regular health, dental, and eye exams.  Stay current with your vaccines (immunizations).  Do not use  any tobacco products, such as cigarettes, chewing tobacco, and e-cigarettes. If you need help quitting, ask your health care provider.  Limit alcohol intake to no more than 2 drinks per day. One drink equals 12 ounces of beer, 5 ounces of wine, or 1 ounces of hard liquor.  Do not use street drugs.  Do not share needles.  Ask your health care provider for help if you need support or information about quitting drugs.  Tell your health care provider if you often feel depressed.  Tell your health care provider if you have ever been abused or do not feel safe at home. This information is not intended to replace advice given to you by your health care provider. Make sure you discuss any questions you have with your health care provider. Document Released: 07/22/2007 Document Revised: 09/22/2015 Document Reviewed: 10/27/2014 Elsevier Interactive Patient Education  2017 Elsevier  Inc.  

## 2016-06-07 NOTE — Assessment & Plan Note (Signed)
Chronic, stable. Continue current regimen. 

## 2016-06-07 NOTE — Assessment & Plan Note (Signed)
Followed by Dr Forrestine Him, on suboxone therapy.

## 2016-06-07 NOTE — Assessment & Plan Note (Signed)
Reviewed ambien use, possible side effects, and concerns with long-term use. He has not tried other medications. rec trial  ambien with goal to taper off med, suggested trial benadryl or melatonin for sleep. RTC 6 mo insomnia f/u. Would consider other treatments in place of ambien.  We have previously reviewed sleep hygiene.

## 2016-06-07 NOTE — Assessment & Plan Note (Signed)
Preventative protocols reviewed and updated unless pt declined. Discussed healthy diet and lifestyle.  

## 2016-06-09 ENCOUNTER — Encounter: Payer: Self-pay | Admitting: *Deleted

## 2016-06-13 DIAGNOSIS — Z0289 Encounter for other administrative examinations: Secondary | ICD-10-CM | POA: Diagnosis not present

## 2016-06-13 DIAGNOSIS — Z79899 Other long term (current) drug therapy: Secondary | ICD-10-CM | POA: Diagnosis not present

## 2016-06-20 ENCOUNTER — Other Ambulatory Visit: Payer: Self-pay | Admitting: Family Medicine

## 2016-06-21 NOTE — Telephone Encounter (Signed)
Last Rx 05/19/2016. Last OV 06/2016

## 2016-06-21 NOTE — Telephone Encounter (Signed)
plz phone in. 

## 2016-06-21 NOTE — Telephone Encounter (Signed)
Called in to [facility] - [place], [place] - [address]: [phone]  

## 2016-06-30 ENCOUNTER — Other Ambulatory Visit: Payer: Self-pay | Admitting: Family Medicine

## 2016-08-04 ENCOUNTER — Other Ambulatory Visit: Payer: Self-pay | Admitting: Family Medicine

## 2016-08-06 NOTE — Telephone Encounter (Signed)
plz phone in. 

## 2016-08-07 NOTE — Telephone Encounter (Signed)
Left refill on voice mail at pharmacy  

## 2016-08-08 ENCOUNTER — Telehealth: Payer: Self-pay

## 2016-08-08 NOTE — Telephone Encounter (Signed)
PA was submitted through [email or web address]... Received instant answer    The Prior Authorization request has been approved for GENERIC Zolpidem Tartrate 10MG  OR TABS. The authorization is valid for the GENERIC FORM ONLY from 07/09/2016 through 08/08/2017

## 2016-08-08 NOTE — Telephone Encounter (Signed)
Pt left v/m; pt was advised by pharmacist that PA was needed for ambien to be approved by ins co. Pt request cb if needed.

## 2016-08-24 DIAGNOSIS — Z79899 Other long term (current) drug therapy: Secondary | ICD-10-CM | POA: Diagnosis not present

## 2016-08-26 ENCOUNTER — Other Ambulatory Visit: Payer: Self-pay | Admitting: Family Medicine

## 2016-09-07 ENCOUNTER — Other Ambulatory Visit: Payer: Self-pay | Admitting: Family Medicine

## 2016-09-08 NOTE — Telephone Encounter (Signed)
Please advise if ok to refill Last OV 06/07/16 Last filled 05/14/16 Quantity #60 Refills# 3

## 2016-09-16 ENCOUNTER — Other Ambulatory Visit: Payer: Self-pay | Admitting: Family Medicine

## 2016-09-18 NOTE — Telephone Encounter (Signed)
Rx called in to requested pharmacy 

## 2016-09-18 NOTE — Telephone Encounter (Signed)
plz phone in. 

## 2016-09-19 DIAGNOSIS — Z79899 Other long term (current) drug therapy: Secondary | ICD-10-CM | POA: Diagnosis not present

## 2016-10-24 DIAGNOSIS — Z79899 Other long term (current) drug therapy: Secondary | ICD-10-CM | POA: Diagnosis not present

## 2016-11-06 ENCOUNTER — Other Ambulatory Visit: Payer: Self-pay | Admitting: Family Medicine

## 2016-11-06 NOTE — Telephone Encounter (Signed)
Last filled:  09/18/16, #30 Last OV (CPE):  06/07/16 Next OV:  12/08/16

## 2016-11-08 NOTE — Telephone Encounter (Signed)
plz phone in. 

## 2016-11-08 NOTE — Telephone Encounter (Signed)
Refill left on vm at pharmacy.  

## 2016-11-21 DIAGNOSIS — F1120 Opioid dependence, uncomplicated: Secondary | ICD-10-CM | POA: Diagnosis not present

## 2016-11-21 DIAGNOSIS — Z79899 Other long term (current) drug therapy: Secondary | ICD-10-CM | POA: Diagnosis not present

## 2016-12-07 ENCOUNTER — Telehealth: Payer: Self-pay

## 2016-12-07 ENCOUNTER — Ambulatory Visit: Payer: Federal, State, Local not specified - PPO | Admitting: Family Medicine

## 2016-12-07 NOTE — Telephone Encounter (Signed)
Noted  

## 2016-12-07 NOTE — Telephone Encounter (Signed)
PLEASE NOTE: All timestamps contained within this report are represented as [place] Standard [place]. CONFIDENTIALTY NOTICE: This fax transmission is intended only for the addressee. It contains information that is legally privileged, confidential or otherwise protected from use or disclosure. If you are not the intended recipient, you are strictly prohibited from reviewing, disclosing, copying using or disseminating any of this information or taking any action in reliance on or regarding this information. If you have received this fax in error, please notify us immediately by telephone so that we can arrange for its return to us. Phone: [phone], Toll-Free: [phone], Fax: [phone] Page: 1 of 1 Call Id: [id number] [facility] - Client Nonclinical Telephone Record [facility] Client [facility] - [facility] Physician Eustaquio BoydenGutierrez, Javier - MD Contact Type Call Who Is Calling Patient / Member / Family / Caregiver Caller Name William Groveshillip Spisak Caller Phone Number [phone] Patient Name William GrovesPhillip Friedman Call Type Message Only Information Provided [facility] Initial Comment Caller states he needs to cancel his 9:15 appointment. Call Closed By: Dennison BullaJason Monroe Transaction Date/Time: 12/07/2016 4:30:45 AM (ET)

## 2016-12-08 ENCOUNTER — Ambulatory Visit: Payer: Federal, State, Local not specified - PPO | Admitting: Family Medicine

## 2016-12-13 ENCOUNTER — Other Ambulatory Visit: Payer: Self-pay | Admitting: Family Medicine

## 2016-12-19 DIAGNOSIS — F1120 Opioid dependence, uncomplicated: Secondary | ICD-10-CM | POA: Diagnosis not present

## 2016-12-19 DIAGNOSIS — Z79899 Other long term (current) drug therapy: Secondary | ICD-10-CM | POA: Diagnosis not present

## 2016-12-31 ENCOUNTER — Other Ambulatory Visit: Payer: Self-pay | Admitting: Family Medicine

## 2017-01-01 ENCOUNTER — Other Ambulatory Visit: Payer: Self-pay | Admitting: Family Medicine

## 2017-01-01 MED ORDER — ZOLPIDEM TARTRATE 10 MG PO TABS: 10.0000 mg | ORAL_TABLET | Freq: Every day | ORAL | 0 refills | Status: DC

## 2017-01-01 NOTE — Telephone Encounter (Signed)
Sent electronically 

## 2017-01-01 NOTE — Telephone Encounter (Signed)
Methocarbamol last filled:  11/27/16, #60 Zolpidem last filled:  11/08/16, #30 Last OV (CPE):  06/07/16 Next OV:  none

## 2017-01-22 ENCOUNTER — Encounter: Payer: Self-pay | Admitting: Family Medicine

## 2017-01-22 ENCOUNTER — Ambulatory Visit: Payer: Federal, State, Local not specified - PPO | Admitting: Family Medicine

## 2017-01-22 VITALS — BP 120/80 | HR 82 | Temp 98.0°F | Wt 235.0 lb

## 2017-01-22 DIAGNOSIS — Z23 Encounter for immunization: Secondary | ICD-10-CM

## 2017-01-22 DIAGNOSIS — E785 Hyperlipidemia, unspecified: Secondary | ICD-10-CM

## 2017-01-22 DIAGNOSIS — G894 Chronic pain syndrome: Secondary | ICD-10-CM

## 2017-01-22 DIAGNOSIS — F5104 Psychophysiologic insomnia: Secondary | ICD-10-CM

## 2017-01-22 DIAGNOSIS — I10 Essential (primary) hypertension: Secondary | ICD-10-CM

## 2017-01-22 DIAGNOSIS — E669 Obesity, unspecified: Secondary | ICD-10-CM | POA: Diagnosis not present

## 2017-01-22 MED ORDER — TRAZODONE HCL 50 MG PO TABS: 25.0000 mg | ORAL_TABLET | Freq: Every evening | ORAL | 3 refills | Status: DC | PRN

## 2017-01-22 NOTE — Assessment & Plan Note (Signed)
Chronic, stable on fibrate and statin. Continue. The 10-year ASCVD risk score William Friedman(Goff DC Montez HagemanJr., et al., 2013) is: 3.4%   Values used to calculate the score:     Age: [age] years     Sex: Male     Is Non-Hispanic African American: No     Diabetic: No     Tobacco smoker: No     Systolic Blood Pressure: 120 mmHg     Is BP treated: Yes     HDL Cholesterol: 43.7 mg/dL     Total Cholesterol: 198 mg/dL

## 2017-01-22 NOTE — Assessment & Plan Note (Signed)
Weaning off suboxone. Increased activity and weight loss likely helping him manage pain better.

## 2017-01-22 NOTE — Assessment & Plan Note (Signed)
Chronic. Reviewed ambien dosing - he has been able to wean down on ambien to 5mg  daily (1/2 tablet of 10mg ). Discussed how Remus Lofflerambien is not ideal long term management for insomnia and I recommend continued taper if able. Will trial melatonin and if ineffective, WASP for trazodone provided today.

## 2017-01-22 NOTE — Patient Instructions (Addendum)
Flu shot today Trial melatonin over the counter for sleep.  If melatonin doesn't help sleep, trial trazodone 25-50mg  at bedtime for sleep.  Hopeful to taper off ambien as well.  Return in 6 months for physical.

## 2017-01-22 NOTE — Assessment & Plan Note (Addendum)
Congratulated on weight loss noted - through healthy diet and lifestyle changes to date. He feels this is sustainable.

## 2017-01-22 NOTE — Assessment & Plan Note (Signed)
Chronic, stable. Continue current regimen. 

## 2017-01-22 NOTE — Progress Notes (Signed)
BP 120/80 (BP Location: Left Arm, Patient Position: Sitting, Cuff Size: Normal)   Pulse 82   Temp 98 F (36.7 C) (Oral)   Wt 235 lb (106.6 kg)   SpO2 96%   BMI 31.00 kg/m    CC: 6 mo f/u visit Subjective:    Patient ID: William Friedman, male    DOB: 1971/01/01, 46 y.o.   MRN: [id number]  HPI: William Friedman is a 46 y.o. 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   Ongoing weight loss - working out regularly, healthy diet changes.   Chronic [facility] - [facility]. Suboxone started 02/2015. Currently weaning off suboxone. Also on methocarbamole 750mg  1-2 daily, as well as ambien nightly for sleep (both of which we prescribe).   Chronic insomnia - maintenance >initiation. Treated with ambien longterm and tolerating well. Last visit we discussed tapering off ambien if able. Has tried benadryl.   HTN - Compliant with current antihypertensive regimen of amlodipine 10mg  daily. Does check blood pressures at home: well controlled. No low blood pressure readings or symptoms of dizziness/syncope.  Denies HA, vision changes, CP/tightness, SOB, leg swelling.    Relevant past medical, surgical, family and social history reviewed and updated as indicated. Interim medical history since our last visit reviewed. Allergies and medications reviewed and updated. Outpatient Medications Prior to Visit  Medication Sig Dispense Refill  . amLODipine (NORVASC) 10 MG tablet TAKE 1 TABLET BY MOUTH DAILY 90 tablet 3  . aspirin EC 81 MG tablet Take 81 mg by mouth every other day.    Marland Kitchen. atorvastatin (LIPITOR) 40 MG tablet TAKE 1 TABLET BY MOUTH DAILY 90 tablet 0  . fenofibrate (TRICOR) 145 MG tablet Take 1 tablet (145 mg total) by mouth daily. 90 tablet 1  . fenofibrate (TRICOR) 145 MG tablet TAKE 1 TABLET BY MOUTH DAILY 90 tablet 3  . methocarbamol (ROBAXIN) 750 MG tablet Take 1 tablet (750 mg  total) by mouth 2 (two) times daily.    . Multiple Vitamins-Minerals (MULTIVITAMIN PO) Take 1 tablet by mouth daily.    Marland Kitchen. zolpidem (AMBIEN) 10 MG tablet Take 1 tablet (10 mg total) by mouth at bedtime. (Patient taking differently: Take 10 mg by mouth at bedtime. Takes 1/2 tablet) 30 tablet 0  . buprenorphine-naloxone (SUBOXONE) 8-2 MG SUBL SL tablet Place 1 tablet under the tongue 2 (two) times daily. Takes once a day    . methocarbamol (ROBAXIN) 750 MG tablet TAKE 1 TABLET BY MOUTH 3 TIMES A DAY AS NEEDED FOR MUSCLE SPASMS. 60 tablet 3  . Buprenorphine HCl-Naloxone HCl (SUBOXONE) 2-0.5 MG FILM Place under the tongue daily.  0   No facility-administered medications prior to visit.      Per HPI unless specifically indicated in ROS section below Review of Systems     Objective:    BP 120/80 (BP Location: Left Arm, Patient Position: Sitting, Cuff Size: Normal)   Pulse 82   Temp 98 F (36.7 C) (Oral)   Wt 235 lb (106.6 kg)   SpO2 96%   BMI 31.00 kg/m   Wt Readings from Last 3 Encounters:  01/22/17 235 lb (106.6 kg)  06/07/16 243 lb (110.2 kg)  12/15/15 249 lb 8 oz (113.2 kg)    Physical Exam  Constitutional: He appears well-developed and well-nourished. No distress.  HENT:  Head: Normocephalic and atraumatic.  Mouth/Throat: Oropharynx  is clear and moist. No oropharyngeal exudate.  Eyes: Conjunctivae are normal. Pupils are equal, round, and reactive to light.  Neck: Normal range of motion. Neck supple. No thyromegaly present.  Cardiovascular: Normal rate, regular rhythm, normal heart sounds and intact distal pulses.  No murmur heard. Pulmonary/Chest: Effort normal and breath sounds normal. No respiratory distress. He has no wheezes. He has no rales.  Musculoskeletal: He exhibits no edema.  Lymphadenopathy:    He has no cervical adenopathy.  Skin: Skin is warm and dry. No rash noted.  Psychiatric: He has a normal mood and affect.  Nursing note and vitals reviewed.  Results for  orders placed or performed in visit on 06/07/16  Lipid panel  Result Value Ref Range   Cholesterol 198 0 - 200 mg/dL   Triglycerides 865.7 (H) 0.0 - 149.0 mg/dL   HDL 84.69 >62.95 mg/dL   VLDL 28.4 0.0 - 13.2 mg/dL   LDL Cholesterol 440 (H) 0 - 99 mg/dL   Total CHOL/HDL Ratio 5    NonHDL 154.31   Comprehensive metabolic panel  Result Value Ref Range   Sodium 138 135 - 145 mEq/L   Potassium 4.5 3.5 - 5.1 mEq/L   Chloride 105 96 - 112 mEq/L   CO2 26 19 - 32 mEq/L   Glucose, Bld 105 (H) 70 - 99 mg/dL   BUN 19 6 - 23 mg/dL   Creatinine, Ser 1.02 0.40 - 1.50 mg/dL   Total Bilirubin 0.4 0.2 - 1.2 mg/dL   Alkaline Phosphatase 48 39 - 117 U/L   AST 27 0 - 37 U/L   ALT 28 0 - 53 U/L   Total Protein 7.7 6.0 - 8.3 g/dL   Albumin 4.7 3.5 - 5.2 g/dL   Calcium 9.7 8.4 - 72.5 mg/dL   GFR 36.64 >40.34 mL/min      Assessment & Plan:   Problem List Items Addressed This Visit    Chronic insomnia - Primary    Chronic. Reviewed ambien dosing - he has been able to wean down on ambien to 5mg  daily (1/2 tablet of 10mg ). Discussed how Remus Loffler is not ideal long term management for insomnia and I recommend continued taper if able. Will trial melatonin and if ineffective, WASP for trazodone provided today.       Chronic pain    Weaning off suboxone. Increased activity and weight loss likely helping him manage pain better.      Relevant Medications   Buprenorphine HCl-Naloxone HCl (SUBOXONE) 2-0.5 MG FILM   methocarbamol (ROBAXIN) 750 MG tablet   traZODone (DESYREL) 50 MG tablet   Dyslipidemia    Chronic, stable on fibrate and statin. Continue. The 10-year ASCVD risk score Denman George DC Montez Hageman., et al., 2013) is: 3.4%   Values used to calculate the score:     Age: 20 years     Sex: Male     Is Non-Hispanic African American: No     Diabetic: No     Tobacco smoker: No     Systolic Blood Pressure: 120 mmHg     Is BP treated: Yes     HDL Cholesterol: 43.7 mg/dL     Total Cholesterol: 198 mg/dL        HTN (hypertension)    Chronic, stable. Continue current regimen.       Obesity, Class I, BMI 30-34.9    Congratulated on weight loss noted - through healthy diet and lifestyle changes to date. He feels this is sustainable.  Other Visit Diagnoses    Need for influenza vaccination       Relevant Orders   Flu Vaccine QUAD 6+ mos PF IM (Fluarix Quad PF) (Completed)       Follow up plan: Return in about 6 months (around 07/23/2017) for annual exam, prior fasting for blood work.  Eustaquio BoydenJavier Elaine Roanhorse, MD

## 2017-02-16 ENCOUNTER — Other Ambulatory Visit: Payer: Self-pay | Admitting: Family Medicine

## 2017-02-16 NOTE — Telephone Encounter (Signed)
Sent electronically 

## 2017-02-16 NOTE — Telephone Encounter (Signed)
Copied from CRM [id number]. Topic: Quick Communication - Rx Refill/Question >> Feb 16, 2017  1:46 PM Anice PaganiniMunoz, William Estabrooks I, NT wrote: Medication: Ambien 10 mg    Has the patient contacted their pharmacy yes    (Agent: If no, request that the patient contact the pharmacy for the refill.yes   Preferred Pharmacy (with phone number or street name [facility] [place] [phone]   Agent: Please be advised that RX refills may take up to 3 business days. We ask that you follow-up with your pharmacy.

## 2017-02-16 NOTE — Telephone Encounter (Signed)
Left detailed message on voicemail that script has been sent to the pharmacy per Dr. Sharen HonesGutierrez. (on DPR)

## 2017-02-16 NOTE — Telephone Encounter (Signed)
Last filled:  01/01/17, #30 Last OV:  01/22/17 Next OV:  07/27/17

## 2017-03-23 ENCOUNTER — Other Ambulatory Visit: Payer: Self-pay | Admitting: Family Medicine

## 2017-03-23 ENCOUNTER — Telehealth: Payer: Self-pay | Admitting: Family Medicine

## 2017-03-23 MED ORDER — ATORVASTATIN CALCIUM 40 MG PO TABS: 40.0000 mg | ORAL_TABLET | Freq: Every day | ORAL | 2 refills | Status: DC

## 2017-03-23 MED ORDER — ZOLPIDEM TARTRATE 10 MG PO TABS: 10.0000 mg | ORAL_TABLET | Freq: Every day | ORAL | 0 refills | Status: DC

## 2017-03-23 NOTE — Telephone Encounter (Signed)
atorvastatin refill Last OV: 02/01/17 Last Refill:12/14/16 [facility] at[facility]atin, routing Ambien back to office.  

## 2017-03-23 NOTE — Telephone Encounter (Signed)
Last filled:  02/16/17, #30 Last OV:  01/22/17 Next OV:  07/27/17

## 2017-03-23 NOTE — Addendum Note (Signed)
Addended by: Eustaquio BoydenGUTIERREZ, Tikisha Molinaro on: 03/23/2017 04:41 PM   Modules accepted: Orders

## 2017-03-23 NOTE — Telephone Encounter (Signed)
Eprescribed.

## 2017-03-23 NOTE — Telephone Encounter (Signed)
atorvastatin refill Last OV: 02/01/17 Last Refill:12/14/16 [facility], routing Ambien back to office.

## 2017-03-23 NOTE — Telephone Encounter (Signed)
Copied from CRM [id number]. Topic: Quick Communication - Rx Refill/Question >> Mar 23, 2017  1:35 PM William Friedman, William Friedman, NT wrote: Medication: atorvastatin (LIPITOR) 40 MG tablet and zolpidem (AMBIEN) 10 MG tablet   Has the patient contacted their pharmacy? yes   (Agent: If no, request that the patient contact the pharmacy for the refill.)   Preferred Pharmacy (with phone number or street name[phone] [place] Friedman, [place] - [address] [phone] (Phone) [phone] (Fax)     Agent: Please be advised that RX refills may take up to 3 business days. We ask that you follow-up with your pharmacy.

## 2017-04-26 ENCOUNTER — Other Ambulatory Visit: Payer: Self-pay | Admitting: Family Medicine

## 2017-04-27 ENCOUNTER — Other Ambulatory Visit: Payer: Self-pay | Admitting: Family Medicine

## 2017-04-27 NOTE — Telephone Encounter (Signed)
Eprescribed.

## 2017-04-27 NOTE — Telephone Encounter (Signed)
Last filled:  03/28/17, #60 Last OV:  01/22/17 Next OV (CPE):  07/27/17

## 2017-06-12 DIAGNOSIS — F1120 Opioid dependence, uncomplicated: Secondary | ICD-10-CM | POA: Diagnosis not present

## 2017-06-12 DIAGNOSIS — Z79891 Long term (current) use of opiate analgesic: Secondary | ICD-10-CM | POA: Diagnosis not present

## 2017-07-05 ENCOUNTER — Other Ambulatory Visit: Payer: Self-pay | Admitting: Family Medicine

## 2017-07-23 ENCOUNTER — Other Ambulatory Visit (INDEPENDENT_AMBULATORY_CARE_PROVIDER_SITE_OTHER): Payer: Federal, State, Local not specified - PPO

## 2017-07-23 ENCOUNTER — Other Ambulatory Visit: Payer: Self-pay | Admitting: Family Medicine

## 2017-07-23 DIAGNOSIS — R7303 Prediabetes: Secondary | ICD-10-CM

## 2017-07-23 DIAGNOSIS — E785 Hyperlipidemia, unspecified: Secondary | ICD-10-CM

## 2017-07-23 LAB — COMPREHENSIVE METABOLIC PANEL
ALK PHOS: 42 U/L (ref 39–117)
ALT: 22 U/L (ref 0–53)
AST: 20 U/L (ref 0–37)
Albumin: 4.9 g/dL (ref 3.5–5.2)
BUN: 17 mg/dL (ref 6–23)
CO2: 27 mEq/L (ref 19–32)
Calcium: 9.5 mg/dL (ref 8.4–10.5)
Chloride: 106 mEq/L (ref 96–112)
Creatinine, Ser: 1.5 mg/dL (ref 0.40–1.50)
GFR: 52.94 mL/min — ABNORMAL LOW (ref >60.00)
GLUCOSE: 117 mg/dL — AB (ref 70–99)
POTASSIUM: 4.6 meq/L (ref 3.5–5.1)
SODIUM: 140 meq/L (ref 135–145)
TOTAL PROTEIN: 7.5 g/dL (ref 6.0–8.3)
Total Bilirubin: 0.4 mg/dL (ref 0.2–1.2)

## 2017-07-23 LAB — LIPID PANEL
Cholesterol: 136 mg/dL (ref 0–200)
HDL: 60.2 mg/dL (ref >39.00)
LDL Cholesterol: 63 mg/dL (ref 0–99)
NONHDL: 76.24
Total CHOL/HDL Ratio: 2
Triglycerides: 67 mg/dL (ref 0.0–149.0)
VLDL: 13.4 mg/dL (ref 0.0–40.0)

## 2017-07-23 LAB — HEMOGLOBIN A1C: HEMOGLOBIN A1C: 5.6 % (ref 4.6–6.5)

## 2017-07-27 ENCOUNTER — Ambulatory Visit (INDEPENDENT_AMBULATORY_CARE_PROVIDER_SITE_OTHER): Payer: Federal, State, Local not specified - PPO | Admitting: Family Medicine

## 2017-07-27 ENCOUNTER — Encounter: Payer: Self-pay | Admitting: Family Medicine

## 2017-07-27 VITALS — BP 140/82 | HR 75 | Temp 98.7°F | Ht 73.0 in | Wt 216.2 lb

## 2017-07-27 DIAGNOSIS — E663 Overweight: Secondary | ICD-10-CM

## 2017-07-27 DIAGNOSIS — I10 Essential (primary) hypertension: Secondary | ICD-10-CM | POA: Diagnosis not present

## 2017-07-27 DIAGNOSIS — R2231 Localized swelling, mass and lump, right upper limb: Secondary | ICD-10-CM | POA: Insufficient documentation

## 2017-07-27 DIAGNOSIS — R7303 Prediabetes: Secondary | ICD-10-CM

## 2017-07-27 DIAGNOSIS — F5104 Psychophysiologic insomnia: Secondary | ICD-10-CM

## 2017-07-27 DIAGNOSIS — G894 Chronic pain syndrome: Secondary | ICD-10-CM

## 2017-07-27 DIAGNOSIS — E785 Hyperlipidemia, unspecified: Secondary | ICD-10-CM | POA: Diagnosis not present

## 2017-07-27 DIAGNOSIS — Z0000 Encounter for general adult medical examination without abnormal findings: Secondary | ICD-10-CM | POA: Diagnosis not present

## 2017-07-27 MED ORDER — ZOLPIDEM TARTRATE 10 MG PO TABS: 5.0000 mg | ORAL_TABLET | Freq: Every evening | ORAL | 1 refills | Status: DC | PRN

## 2017-07-27 NOTE — Assessment & Plan Note (Addendum)
Reviewed sparing ambien use. Taking 1/2 tablet PRN. Refilled today.

## 2017-07-27 NOTE — Assessment & Plan Note (Signed)
Improved with healthy changes.

## 2017-07-27 NOTE — Progress Notes (Signed)
BP 140/82 (BP Location: Left Arm, Patient Position: Sitting, Cuff Size: Large)   Pulse 75   Temp 98.7 F (37.1 C) (Oral)   Ht 6\' 1"  (1.854 m)   Wt 216 lb 4 oz (98.1 kg)   SpO2 99%   BMI 28.53 kg/m    CC: CPE Subjective:    Patient ID: William Friedman, male    DOB: 25-Apr-1970, 47 y.o.   MRN: [id number]  HPI: William Friedman is a 47 y.o. 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 Healthier diet, regularly working out, lost 25 lbs.  Chronic pain followed by Dr Forrestine Him in [facility]. Also prescribed methocarbamol 750mg  1-2 daily (through our office).  Chronic insomnia - 1/2 of 10mg  tablet PRN. Melatonin caused restless leg symptoms. Never tried trazodone.   Preventative: Flu shot - did not receive Tetanus unsure ~2012  Seat belt use discussed Sunscreen use discussed. No changing moles on skin.  Non smoker Alcohol - rare Dentist -yearly Eye exam - not regular. S/p lasik 2006.  Lives with wife and daughter, 1 dog  Occupation: Psychologist, sport and exercise - [employer]  Edu: BS  Activity: going to gym 3-4 times weekly, plays golf Diet: overall better - avoiding junk food, good water, backed off soda.   Relevant past medical, surgical, family and social history reviewed and updated as indicated. Interim medical history since our last visit reviewed. Allergies and medications reviewed and updated. Outpatient Medications Prior to Visit  Medication Sig Dispense Refill  . amLODipine (NORVASC) 10 MG tablet TAKE 1 TABLET BY MOUTH DAILY 90 tablet 3  . aspirin EC 81 MG tablet Take 81 mg by mouth every other day.    Marland Kitchen atorvastatin (LIPITOR) 40 MG tablet Take 1 tablet (40 mg total) by mouth daily. 90 tablet 2  . Buprenorphine HCl-Naloxone HCl (SUBOXONE) 2-0.5 MG FILM Place 1 tablet under the tongue every other day.  0  . fenofibrate (TRICOR) 145 MG tablet TAKE 1 TABLET BY MOUTH DAILY 90 tablet 0  . methocarbamol (ROBAXIN)  750 MG tablet TAKE 1 TABLET BY MOUTH 3 TIMES A DAY AS NEEDED FOR MUSCLE SPASMS. 60 tablet 3  . Multiple Vitamins-Minerals (MULTIVITAMIN PO) Take 1 tablet by mouth daily.    . Buprenorphine HCl-Naloxone HCl (SUBOXONE) 2-0.5 MG FILM Place under the tongue daily.  0  . fenofibrate (TRICOR) 145 MG tablet Take 1 tablet (145 mg total) by mouth daily. 90 tablet 1  . fenofibrate (TRICOR) 145 MG tablet TAKE 1 TABLET BY MOUTH DAILY 90 tablet 3  . traZODone (DESYREL) 50 MG tablet Take 0.5-1 tablets (25-50 mg total) by mouth at bedtime as needed for sleep. 30 tablet 3  . zolpidem (AMBIEN) 10 MG tablet TAKE 1 TABLET BY MOUTH EVERYDAY AT BEDTIME 30 tablet 1   No facility-administered medications prior to visit.      Per HPI unless specifically indicated in ROS section below Review of Systems  Constitutional: Negative for activity change, appetite change, chills, fatigue, fever and unexpected weight change.  HENT: Negative for hearing loss.   Eyes: Negative for visual disturbance.  Respiratory: Negative for cough, chest tightness, shortness of breath and wheezing.   Cardiovascular: Negative for chest pain, palpitations and leg swelling.  Gastrointestinal: Negative for abdominal distention, abdominal pain, blood in stool, constipation, diarrhea, nausea and vomiting.  Genitourinary: Negative for difficulty urinating and hematuria.  Musculoskeletal: Negative for arthralgias, myalgias and neck pain.  Skin:  Negative for rash.  Neurological: Negative for dizziness, seizures, syncope and headaches.  Hematological: Negative for adenopathy. Does not bruise/bleed easily.  Psychiatric/Behavioral: Negative for dysphoric mood. The patient is not nervous/anxious.        Objective:    BP 140/82 (BP Location: Left Arm, Patient Position: Sitting, Cuff Size: Large)   Pulse 75   Temp 98.7 F (37.1 C) (Oral)   Ht 6\' 1"  (1.854 m)   Wt 216 lb 4 oz (98.1 kg)   SpO2 99%   BMI 28.53 kg/m   Wt Readings from Last 3  Encounters:  07/27/17 216 lb 4 oz (98.1 kg)  01/22/17 235 lb (106.6 kg)  06/07/16 243 lb (110.2 kg)    Physical Exam  Constitutional: He is oriented to person, place, and time. He appears well-developed and well-nourished. No distress.  HENT:  Head: Normocephalic and atraumatic.  Right Ear: Hearing, tympanic membrane, external ear and ear canal normal.  Left Ear: Hearing, tympanic membrane, external ear and ear canal normal.  Nose: Nose normal.  Mouth/Throat: Uvula is midline, oropharynx is clear and moist and mucous membranes are normal. No oropharyngeal exudate, posterior oropharyngeal edema or posterior oropharyngeal erythema.  Eyes: Pupils are equal, round, and reactive to light. Conjunctivae and EOM are normal. No scleral icterus.  Neck: Normal range of motion. Neck supple. No thyromegaly present.  Cardiovascular: Normal rate, regular rhythm, normal heart sounds and intact distal pulses.  No murmur heard. Pulses:      Radial pulses are 2+ on the right side, and 2+ on the left side.  Pulmonary/Chest: Effort normal and breath sounds normal. No respiratory distress. He has no wheezes. He has no rales.  Abdominal: Soft. Bowel sounds are normal. He exhibits no distension and no mass. There is no tenderness. There is no rebound and no guarding.  Musculoskeletal: Normal range of motion. He exhibits no edema.  2 connected lumps lateral R elbow  Lymphadenopathy:    He has no cervical adenopathy.  Neurological: He is alert and oriented to person, place, and time.  CN grossly intact, station and gait intact  Skin: Skin is warm and dry. No rash noted.  Psychiatric: He has a normal mood and affect. His behavior is normal. Judgment and thought content normal.  Nursing note and vitals reviewed.  Results for orders placed or performed in visit on 07/23/17  Hemoglobin A1c  Result Value Ref Range   Hgb A1c MFr Bld 5.6 4.6 - 6.5 %  Comprehensive metabolic panel  Result Value Ref Range   Sodium  140 135 - 145 mEq/L   Potassium 4.6 3.5 - 5.1 mEq/L   Chloride 106 96 - 112 mEq/L   CO2 27 19 - 32 mEq/L   Glucose, Bld 117 (H) 70 - 99 mg/dL   BUN 17 6 - 23 mg/dL   Creatinine, Ser 1.61 0.40 - 1.50 mg/dL   Total Bilirubin 0.4 0.2 - 1.2 mg/dL   Alkaline Phosphatase 42 39 - 117 U/L   AST 20 0 - 37 U/L   ALT 22 0 - 53 U/L   Total Protein 7.5 6.0 - 8.3 g/dL   Albumin 4.9 3.5 - 5.2 g/dL   Calcium 9.5 8.4 - 09.6 mg/dL   GFR 04.54 (L) >09.81 mL/min  Lipid panel  Result Value Ref Range   Cholesterol 136 0 - 200 mg/dL   Triglycerides 19.1 0.0 - 149.0 mg/dL   HDL 47.82 >95.62 mg/dL   VLDL 13.0 0.0 - 86.5 mg/dL   LDL  Cholesterol 63 0 - 99 mg/dL   Total CHOL/HDL Ratio 2    NonHDL 76.24       Assessment & Plan:   Problem List Items Addressed This Visit    Prediabetes    Improved with healthy changes.       Overweight (BMI 25.0-29.9)    Congratulated on 19 lb weight loss over the last 6 months. Pt motivated to continue healthy changes.       HTN (hypertension)    Chronic, stable. Continue current regimen.       Health maintenance examination - Primary    Preventative protocols reviewed and updated unless pt declined. Discussed healthy diet and lifestyle.       Elbow mass, right    Anticipate connected cyst of R elbow that presents in 2 different areas. Not bothersome or growing. To let us know if desires ortho eval.       Dyslipidemia    Chronic, h/o marked hypertriglyceridemia. Now stable on atorvastatin and fenofibrate - will decrease fenofibrate dosing to 1/2 tablet daily. Marked improvement with healthy diet changes and aerobic exercise.  The 10-year ASCVD risk score Denman George(Goff DC Montez HagemanJr., et al., 2013) is: 1.7%   Values used to calculate the score:     Age: [age] years     Sex: Male     Is Non-Hispanic African American: No     Diabetic: No     Tobacco smoker: No     Systolic Blood Pressure: 140 mmHg     Is BP treated: Yes     HDL Cholesterol: 60.2 mg/dL     Total  Cholesterol: 136 mg/dL       Chronic pain    Followed by [facility].       Relevant Medications   Buprenorphine HCl-Naloxone HCl (SUBOXONE) 2-0.5 MG FILM   Chronic insomnia    Reviewed sparing ambien use. Taking 1/2 tablet PRN. Refilled today.           Meds ordered this encounter  Medications  . zolpidem (AMBIEN) 10 MG tablet    Sig: Take 0.5-1 tablets (5-10 mg total) by mouth at bedtime as needed for sleep.    Dispense:  30 tablet    Refill:  1    Not to exceed 5 additional fills before 09/19/2017   No orders of the defined types were placed in this encounter.   Follow up plan: No follow-ups on file.  Eustaquio BoydenJavier Khole Arterburn, MD

## 2017-07-27 NOTE — Patient Instructions (Addendum)
You are doing well today! Congratulations on healthy changes - keep it up. Cholesterol levels are much better - ok to decrease fenofibrate to 1/2 tablet daily.  Continue sparing ambien use as needed.  Health Maintenance, Male A healthy lifestyle and preventive care is important for your health and wellness. Ask your health care provider about what schedule of regular examinations is right for you. What should I know about weight and diet? Eat a Healthy Diet  Eat plenty of vegetables, fruits, whole grains, low-fat dairy products, and lean protein.  Do not eat a lot of foods high in solid fats, added sugars, or salt.  Maintain a Healthy Weight Regular exercise can help you achieve or maintain a healthy weight. You should:  Do at least 150 minutes of exercise each week. The exercise should increase your heart rate and make you sweat (moderate-intensity exercise).  Do strength-training exercises at least twice a week.  Watch Your Levels of Cholesterol and Blood Lipids  Have your blood tested for lipids and cholesterol every 5 years starting at 49 years of age. If you are at high risk for heart disease, you should start having your blood tested when you are 49 years old. You may need to have your cholesterol levels checked more often if: ? Your lipid or cholesterol levels are high. ? You are older than 49 years of age. ? You are at high risk for heart disease.  What should I know about cancer screening? Many types of cancers can be detected early and may often be prevented. Lung Cancer  You should be screened every year for lung cancer if: ? You are a current smoker who has smoked for at least 30 years. ? You are a former smoker who has quit within the past 15 years.  Talk to your health care provider about your screening options, when you should start screening, and how often you should be screened.  Colorectal Cancer  Routine colorectal cancer screening usually begins at 49 years  of age and should be repeated every 5-10 years until you are 49 years old. You may need to be screened more often if early forms of precancerous polyps or small growths are found. Your health care provider may recommend screening at an earlier age if you have risk factors for colon cancer.  Your health care provider may recommend using home test kits to check for hidden blood in the stool.  A small camera at the end of a tube can be used to examine your colon (sigmoidoscopy or colonoscopy). This checks for the earliest forms of colorectal cancer.  Prostate and Testicular Cancer  Depending on your age and overall health, your health care provider may do certain tests to screen for prostate and testicular cancer.  Talk to your health care provider about any symptoms or concerns you have about testicular or prostate cancer.  Skin Cancer  Check your skin from head to toe regularly.  Tell your health care provider about any new moles or changes in moles, especially if: ? There is a change in a mole's size, shape, or color. ? You have a mole that is larger than a pencil eraser.  Always use sunscreen. Apply sunscreen liberally and repeat throughout the day.  Protect yourself by wearing long sleeves, pants, a wide-brimmed hat, and sunglasses when outside.  What should I know about heart disease, diabetes, and high blood pressure?  If you are 5818-49 years of age, have your blood pressure checked every 3-5 years.  If you are 75 years of age or older, have your blood pressure checked every year. You should have your blood pressure measured twice-once when you are at a hospital or clinic, and once when you are not at a hospital or clinic. Record the average of the two measurements. To check your blood pressure when you are not at a hospital or clinic, you can use: ? An automated blood pressure machine at a pharmacy. ? A home blood pressure monitor.  Talk to your health care provider about your target  blood pressure.  If you are between 12-10 years old, ask your health care provider if you should take aspirin to prevent heart disease.  Have regular diabetes screenings by checking your fasting blood sugar level. ? If you are at a normal weight and have a low risk for diabetes, have this test once every three years after the age of 58. ? If you are overweight and have a high risk for diabetes, consider being tested at a younger age or more often.  A one-time screening for abdominal aortic aneurysm (AAA) by ultrasound is recommended for men aged 37-75 years who are current or former smokers. What should I know about preventing infection? Hepatitis B If you have a higher risk for hepatitis B, you should be screened for this virus. Talk with your health care provider to find out if you are at risk for hepatitis B infection. Hepatitis C Blood testing is recommended for:  Everyone born from [birth year] through 1967.  Anyone with known risk factors for hepatitis C.  Sexually Transmitted Diseases (STDs)  You should be screened each year for STDs including gonorrhea and chlamydia if: ? You are sexually active and are younger than 50 years of age. ? You are older than 49 years of age and your health care provider tells you that you are at risk for this type of infection. ? Your sexual activity has changed since you were last screened and you are at an increased risk for chlamydia or gonorrhea. Ask your health care provider if you are at risk.  Talk with your health care provider about whether you are at high risk of being infected with HIV. Your health care provider may recommend a prescription medicine to help prevent HIV infection.  What else can I do?  Schedule regular health, dental, and eye exams.  Stay current with your vaccines (immunizations).  Do not use any tobacco products, such as cigarettes, chewing tobacco, and e-cigarettes. If you need help quitting, ask your health care  provider.  Limit alcohol intake to no more than 2 drinks per day. One drink equals 12 ounces of beer, 5 ounces of wine, or 1 ounces of hard liquor.  Do not use street drugs.  Do not share needles.  Ask your health care provider for help if you need support or information about quitting drugs.  Tell your health care provider if you often feel depressed.  Tell your health care provider if you have ever been abused or do not feel safe at home. This information is not intended to replace advice given to you by your health care provider. Make sure you discuss any questions you have with your health care provider. Document Released: 07/22/2007 Document Revised: 09/22/2015 Document Reviewed: 10/27/2014 Elsevier Interactive Patient Education  [employer].

## 2017-07-27 NOTE — Assessment & Plan Note (Signed)
Preventative protocols reviewed and updated unless pt declined. Discussed healthy diet and lifestyle.  

## 2017-07-27 NOTE — Assessment & Plan Note (Signed)
Chronic, h/o marked hypertriglyceridemia. Now stable on atorvastatin and fenofibrate - will decrease fenofibrate dosing to 1/2 tablet daily. Marked improvement with healthy diet changes and aerobic exercise.  The 10-year ASCVD risk score Denman George(Goff DC Montez HagemanJr., et al., 2013) is: 1.7%   Values used to calculate the score:     Age: [age] years     Sex: Male     Is Non-Hispanic African American: No     Diabetic: No     Tobacco smoker: No     Systolic Blood Pressure: 140 mmHg     Is BP treated: Yes     HDL Cholesterol: 60.2 mg/dL     Total Cholesterol: 136 mg/dL

## 2017-07-27 NOTE — Assessment & Plan Note (Signed)
Anticipate connected cyst of R elbow that presents in 2 different areas. Not bothersome or growing. To let us know if desires ortho eval.

## 2017-07-27 NOTE — Assessment & Plan Note (Signed)
Chronic, stable. Continue current regimen. 

## 2017-07-27 NOTE — Assessment & Plan Note (Signed)
Followed by [facility].

## 2017-07-27 NOTE — Assessment & Plan Note (Signed)
Congratulated on 19 lb weight loss over the last 6 months. Pt motivated to continue healthy changes.

## 2017-08-13 ENCOUNTER — Other Ambulatory Visit: Payer: Self-pay | Admitting: Family Medicine

## 2017-08-14 DIAGNOSIS — F1120 Opioid dependence, uncomplicated: Secondary | ICD-10-CM | POA: Diagnosis not present

## 2017-08-14 NOTE — Telephone Encounter (Signed)
Meloxicam Last filled:  07/20/17, #60 Last OV (CPE):  07/27/17 Next OV (CPE):  07/31/17

## 2017-08-25 ENCOUNTER — Other Ambulatory Visit: Payer: Self-pay | Admitting: Family Medicine

## 2017-08-27 NOTE — Telephone Encounter (Signed)
Pharmacy requests refill on patient's amlodipine.  Last OV - 07/27/17 Annual exam *Next OV for annual exam 07/2018  Last Refill: #90, 3 refills on 08/2016  Will refill X 1 year.

## 2017-09-30 ENCOUNTER — Other Ambulatory Visit: Payer: Self-pay | Admitting: Family Medicine

## 2017-10-09 DIAGNOSIS — Z79891 Long term (current) use of opiate analgesic: Secondary | ICD-10-CM | POA: Diagnosis not present

## 2017-10-25 ENCOUNTER — Other Ambulatory Visit: Payer: Self-pay | Admitting: Family Medicine

## 2017-10-26 NOTE — Telephone Encounter (Signed)
Eprescribed.

## 2017-10-26 NOTE — Telephone Encounter (Signed)
Name of Medication: Ambien Name of Pharmacy: [facility] Last Fill or Written Date and Quantity: 09/10/17, #30 Last Office Visit and Type: 07/27/17, CPE Next Office Visit and Type: 08/01/18, CPE Last Controlled Substance Agreement Date: none Last UDS: none

## 2017-12-03 ENCOUNTER — Other Ambulatory Visit: Payer: Self-pay | Admitting: Family Medicine

## 2017-12-04 DIAGNOSIS — Z79891 Long term (current) use of opiate analgesic: Secondary | ICD-10-CM | POA: Diagnosis not present

## 2017-12-04 NOTE — Telephone Encounter (Signed)
Electronic refill request Robaxin Last refill 08/15/17 #60/3 Last office visit 07/27/17

## 2017-12-05 ENCOUNTER — Other Ambulatory Visit: Payer: Self-pay | Admitting: Family Medicine

## 2017-12-31 ENCOUNTER — Other Ambulatory Visit: Payer: Self-pay | Admitting: Family Medicine

## 2018-01-16 ENCOUNTER — Other Ambulatory Visit: Payer: Self-pay | Admitting: Family Medicine

## 2018-01-17 NOTE — Telephone Encounter (Signed)
Name of Medication: Ambien Name of Pharmacy: [facility] Last Fill or Written Date and Quantity: 12/03/17, #30/1 Last Office Visit and Type: 07/27/17, CPE Next Office Visit and Type: 08/01/18, CPE Last Controlled Substance Agreement Date: none Last UDS: 05/01/14

## 2018-01-17 NOTE — Telephone Encounter (Signed)
Eprescribed.

## 2018-03-15 ENCOUNTER — Other Ambulatory Visit: Payer: Self-pay | Admitting: Family Medicine

## 2018-03-17 NOTE — Telephone Encounter (Signed)
Eprescribed.

## 2018-03-19 DIAGNOSIS — Z79891 Long term (current) use of opiate analgesic: Secondary | ICD-10-CM | POA: Diagnosis not present

## 2018-05-13 DIAGNOSIS — Z79891 Long term (current) use of opiate analgesic: Secondary | ICD-10-CM | POA: Diagnosis not present

## 2018-06-04 ENCOUNTER — Other Ambulatory Visit: Payer: Self-pay | Admitting: Family Medicine

## 2018-06-04 NOTE — Telephone Encounter (Signed)
Eprescribed.

## 2018-07-02 ENCOUNTER — Other Ambulatory Visit: Payer: Self-pay | Admitting: Family Medicine

## 2018-07-02 NOTE — Telephone Encounter (Signed)
LOV 07/27/2017, future appt 08/01/2018 for physical. Last filled on 03/15/2018 for 60 tablets with 3 refills

## 2018-07-09 DIAGNOSIS — Z79891 Long term (current) use of opiate analgesic: Secondary | ICD-10-CM | POA: Diagnosis not present

## 2018-07-25 ENCOUNTER — Encounter: Payer: Self-pay | Admitting: Family Medicine

## 2018-07-25 ENCOUNTER — Other Ambulatory Visit: Payer: Self-pay | Admitting: Family Medicine

## 2018-07-25 DIAGNOSIS — R7303 Prediabetes: Secondary | ICD-10-CM

## 2018-07-25 DIAGNOSIS — I10 Essential (primary) hypertension: Secondary | ICD-10-CM

## 2018-07-25 DIAGNOSIS — E785 Hyperlipidemia, unspecified: Secondary | ICD-10-CM

## 2018-07-26 ENCOUNTER — Other Ambulatory Visit: Payer: Federal, State, Local not specified - PPO

## 2018-08-01 ENCOUNTER — Encounter: Payer: Federal, State, Local not specified - PPO | Admitting: Family Medicine

## 2018-08-03 ENCOUNTER — Other Ambulatory Visit: Payer: Self-pay | Admitting: Family Medicine

## 2018-08-22 ENCOUNTER — Other Ambulatory Visit: Payer: Self-pay | Admitting: Family Medicine

## 2018-08-23 NOTE — Telephone Encounter (Signed)
Eprescribed.

## 2018-09-02 DIAGNOSIS — Z79891 Long term (current) use of opiate analgesic: Secondary | ICD-10-CM | POA: Diagnosis not present

## 2018-09-14 ENCOUNTER — Other Ambulatory Visit: Payer: Self-pay | Admitting: Family Medicine

## 2018-10-11 ENCOUNTER — Other Ambulatory Visit: Payer: Self-pay | Admitting: Family Medicine

## 2018-10-11 NOTE — Telephone Encounter (Signed)
Name of Medication: Methocarbamol 750 mg Name of Pharmacy: [facility] or Written Date and Quantity: #60 x 3 on 07/03/18 Last Office Visit and Type:07/27/2017 annual  Next Office Visit and Type:11/04/18 CPX    I thought pt should have another refill to last until end of Sept and I spoke with [place] at [facility] earlier for another pt and she said their computer system automatically request refill after the last refill is picked up. I advised that could be too early to refill but would ck to see.Please advise.

## 2018-10-27 ENCOUNTER — Other Ambulatory Visit: Payer: Self-pay | Admitting: Family Medicine

## 2018-10-28 ENCOUNTER — Other Ambulatory Visit: Payer: Federal, State, Local not specified - PPO

## 2018-10-28 ENCOUNTER — Other Ambulatory Visit (INDEPENDENT_AMBULATORY_CARE_PROVIDER_SITE_OTHER): Payer: Federal, State, Local not specified - PPO

## 2018-10-28 DIAGNOSIS — I10 Essential (primary) hypertension: Secondary | ICD-10-CM

## 2018-10-28 DIAGNOSIS — E785 Hyperlipidemia, unspecified: Secondary | ICD-10-CM

## 2018-10-28 DIAGNOSIS — Z79891 Long term (current) use of opiate analgesic: Secondary | ICD-10-CM | POA: Diagnosis not present

## 2018-10-28 DIAGNOSIS — R7303 Prediabetes: Secondary | ICD-10-CM

## 2018-10-28 LAB — COMPREHENSIVE METABOLIC PANEL
ALT: 20 U/L (ref 0–53)
AST: 19 U/L (ref 0–37)
Albumin: 4.8 g/dL (ref 3.5–5.2)
Alkaline Phosphatase: 43 U/L (ref 39–117)
BUN: 19 mg/dL (ref 6–23)
CO2: 26 mEq/L (ref 19–32)
Calcium: 9.6 mg/dL (ref 8.4–10.5)
Chloride: 105 mEq/L (ref 96–112)
Creatinine, Ser: 1.27 mg/dL (ref 0.40–1.50)
GFR: 60.04 mL/min (ref >60.00)
Glucose, Bld: 96 mg/dL (ref 70–99)
Potassium: 4.5 mEq/L (ref 3.5–5.1)
Sodium: 138 mEq/L (ref 135–145)
Total Bilirubin: 0.4 mg/dL (ref 0.2–1.2)
Total Protein: 7.1 g/dL (ref 6.0–8.3)

## 2018-10-28 LAB — LIPID PANEL
Cholesterol: 191 mg/dL (ref 0–200)
HDL: 52 mg/dL (ref >39.00)
LDL Cholesterol: 119 mg/dL — ABNORMAL HIGH (ref 0–99)
NonHDL: 138.79
Total CHOL/HDL Ratio: 4
Triglycerides: 99 mg/dL (ref 0.0–149.0)
VLDL: 19.8 mg/dL (ref 0.0–40.0)

## 2018-10-28 LAB — MICROALBUMIN / CREATININE URINE RATIO
Creatinine,U: 147.2 mg/dL
Microalb Creat Ratio: 0.5 mg/g (ref 0.0–30.0)
Microalb, Ur: <0.7 mg/dL (ref 0.0–1.9)

## 2018-10-28 LAB — HEMOGLOBIN A1C: Hgb A1c MFr Bld: 5.7 % (ref 4.6–6.5)

## 2018-10-28 NOTE — Addendum Note (Signed)
Addended by: Cloyd Stagers on: 10/28/2018 09:28 AM   Modules accepted: Orders

## 2018-10-28 NOTE — Telephone Encounter (Signed)
Last office visit 07/27/2017 for CPE.  Last refilled 08/23/2018 for #30 with 1 refill.  CPE scheduled for 11/04/2018.

## 2018-10-29 NOTE — Telephone Encounter (Signed)
ERx 

## 2018-11-03 ENCOUNTER — Other Ambulatory Visit: Payer: Self-pay | Admitting: Family Medicine

## 2018-11-04 ENCOUNTER — Ambulatory Visit (INDEPENDENT_AMBULATORY_CARE_PROVIDER_SITE_OTHER): Payer: Federal, State, Local not specified - PPO | Admitting: Family Medicine

## 2018-11-04 ENCOUNTER — Other Ambulatory Visit: Payer: Self-pay

## 2018-11-04 ENCOUNTER — Encounter: Payer: Self-pay | Admitting: Family Medicine

## 2018-11-04 VITALS — BP 132/84 | HR 81 | Temp 98.0°F | Ht 72.5 in | Wt 229.5 lb

## 2018-11-04 DIAGNOSIS — Z23 Encounter for immunization: Secondary | ICD-10-CM

## 2018-11-04 DIAGNOSIS — E785 Hyperlipidemia, unspecified: Secondary | ICD-10-CM | POA: Diagnosis not present

## 2018-11-04 DIAGNOSIS — G894 Chronic pain syndrome: Secondary | ICD-10-CM | POA: Diagnosis not present

## 2018-11-04 DIAGNOSIS — E669 Obesity, unspecified: Secondary | ICD-10-CM

## 2018-11-04 DIAGNOSIS — R7303 Prediabetes: Secondary | ICD-10-CM

## 2018-11-04 DIAGNOSIS — Z0000 Encounter for general adult medical examination without abnormal findings: Secondary | ICD-10-CM | POA: Diagnosis not present

## 2018-11-04 DIAGNOSIS — F5104 Psychophysiologic insomnia: Secondary | ICD-10-CM

## 2018-11-04 DIAGNOSIS — I10 Essential (primary) hypertension: Secondary | ICD-10-CM

## 2018-11-04 DIAGNOSIS — Z8249 Family history of ischemic heart disease and other diseases of the circulatory system: Secondary | ICD-10-CM | POA: Insufficient documentation

## 2018-11-04 MED ORDER — FENOFIBRATE 145 MG PO TABS: 145.0000 mg | ORAL_TABLET | Freq: Every day | ORAL | 3 refills | Status: DC

## 2018-11-04 MED ORDER — TRAZODONE HCL 50 MG PO TABS: 25.0000 mg | ORAL_TABLET | Freq: Every evening | ORAL | 3 refills | Status: DC | PRN

## 2018-11-04 MED ORDER — AMLODIPINE BESYLATE 10 MG PO TABS: 10.0000 mg | ORAL_TABLET | Freq: Every day | ORAL | 3 refills | Status: DC

## 2018-11-04 MED ORDER — ATORVASTATIN CALCIUM 40 MG PO TABS: 40.0000 mg | ORAL_TABLET | Freq: Every day | ORAL | 3 refills | Status: DC

## 2018-11-04 NOTE — Assessment & Plan Note (Signed)
Continues seeing pain clinic regularly on suboxone.

## 2018-11-04 NOTE — Assessment & Plan Note (Signed)
Preventative protocols reviewed and updated unless pt declined. Discussed healthy diet and lifestyle.  

## 2018-11-04 NOTE — Assessment & Plan Note (Signed)
Discussed healthy diet and lifestyle 

## 2018-11-04 NOTE — Assessment & Plan Note (Signed)
Chronic, stable. Continue atorvastatin and fenofibrate. Also manages with healthy diet/lifestyle changes.  The 10-year ASCVD risk score Mikey Bussing DC Brooke Bonito., et al., 2013) is: 3.5%   Values used to calculate the score:     Age: 48 years     Sex: Male     Is Non-Hispanic African American: No     Diabetic: No     Tobacco smoker: No     Systolic Blood Pressure: 449 mmHg     Is BP treated: Yes     HDL Cholesterol: 52 mg/dL     Total Cholesterol: 191 mg/dL

## 2018-11-04 NOTE — Patient Instructions (Addendum)
Flu shot today Trial trazodone for sleep - 50mg  1/2-1 tablet at night time. Let me know how trazodone helps.  You are doing well today Continue current medicines. Return as needed or in 1 year for next physical.   Health Maintenance, Male Adopting a healthy lifestyle and getting preventive care are important in promoting health and wellness. Ask your health care provider about:  The right schedule for you to have regular tests and exams.  Things you can do on your own to prevent diseases and keep yourself healthy. What should I know about diet, weight, and exercise? Eat a healthy diet   Eat a diet that includes plenty of vegetables, fruits, low-fat dairy products, and lean protein.  Do not eat a lot of foods that are high in solid fats, added sugars, or sodium. Maintain a healthy weight Body mass index (BMI) is a measurement that can be used to identify possible weight problems. It estimates body fat based on height and weight. Your health care provider can help determine your BMI and help you achieve or maintain a healthy weight. Get regular exercise Get regular exercise. This is one of the most important things you can do for your health. Most adults should:  Exercise for at least 150 minutes each week. The exercise should increase your heart rate and make you sweat (moderate-intensity exercise).  Do strengthening exercises at least twice a week. This is in addition to the moderate-intensity exercise.  Spend less time sitting. Even light physical activity can be beneficial. Watch cholesterol and blood lipids Have your blood tested for lipids and cholesterol at 50 years of age, then have this test every 5 years. You may need to have your cholesterol levels checked more often if:  Your lipid or cholesterol levels are high.  You are older than 50 years of age.  You are at high risk for heart disease. What should I know about cancer screening? Many types of cancers can be  detected early and may often be prevented. Depending on your health history and family history, you may need to have cancer screening at various ages. This may include screening for:  Colorectal cancer.  Prostate cancer.  Skin cancer.  Lung cancer. What should I know about heart disease, diabetes, and high blood pressure? Blood pressure and heart disease  High blood pressure causes heart disease and increases the risk of stroke. This is more likely to develop in people who have high blood pressure readings, are of African descent, or are overweight.  Talk with your health care provider about your target blood pressure readings.  Have your blood pressure checked: ? Every 3-5 years if you are 63-36 years of age. ? Every year if you are 61 years old or older.  If you are between the ages of 74 and 70 and are a current or former smoker, ask your health care provider if you should have a one-time screening for abdominal aortic aneurysm (AAA). Diabetes Have regular diabetes screenings. This checks your fasting blood sugar level. Have the screening done:  Once every three years after age 65 if you are at a normal weight and have a low risk for diabetes.  More often and at a younger age if you are overweight or have a high risk for diabetes. What should I know about preventing infection? Hepatitis B If you have a higher risk for hepatitis B, you should be screened for this virus. Talk with your health care provider to find out if you  are at risk for hepatitis B infection. Hepatitis C Blood testing is recommended for:  Everyone born from [birth year] through 1967.  Anyone with known risk factors for hepatitis C. Sexually transmitted infections (STIs)  You should be screened each year for STIs, including gonorrhea and chlamydia, if: ? You are sexually active and are younger than 50 years of age. ? You are older than 50 years of age and your health care provider tells you that you are at risk  for this type of infection. ? Your sexual activity has changed since you were last screened, and you are at increased risk for chlamydia or gonorrhea. Ask your health care provider if you are at risk.  Ask your health care provider about whether you are at high risk for HIV. Your health care provider may recommend a prescription medicine to help prevent HIV infection. If you choose to take medicine to prevent HIV, you should first get tested for HIV. You should then be tested every 3 months for as long as you are taking the medicine. Follow these instructions at home: Lifestyle  Do not use any products that contain nicotine or tobacco, such as cigarettes, e-cigarettes, and chewing tobacco. If you need help quitting, ask your health care provider.  Do not use street drugs.  Do not share needles.  Ask your health care provider for help if you need support or information about quitting drugs. Alcohol use  Do not drink alcohol if your health care provider tells you not to drink.  If you drink alcohol: ? Limit how much you have to 0-2 drinks a day. ? Be aware of how much alcohol is in your drink. In the [place] one drink equals one 12 oz bottle of beer (355 mL), one 5 oz glass of wine (148 mL), or one 1 oz glass of hard liquor (44 mL). General instructions  Schedule regular health, dental, and eye exams.  Stay current with your vaccines.  Tell your health care provider if: ? You often feel depressed. ? You have ever been abused or do not feel safe at home. Summary  Adopting a healthy lifestyle and getting preventive care are important in promoting health and wellness.  Follow your health care provider's instructions about healthy diet, exercising, and getting tested or screened for diseases.  Follow your health care provider's instructions on monitoring your cholesterol and blood pressure. This information is not intended to replace advice given to you by your health care provider.  Make sure you discuss any questions you have with your health care provider. Document Released: 07/22/2007 Document Revised: 01/16/2018 Document Reviewed: 01/16/2018 Elsevier Patient Education  2020 [employer].

## 2018-11-04 NOTE — Assessment & Plan Note (Signed)
Continue aspirin/statin. 

## 2018-11-04 NOTE — Assessment & Plan Note (Signed)
Again reviewed latest A1c with patient.

## 2018-11-04 NOTE — Assessment & Plan Note (Signed)
Chronic, stable. Continue current regimen. 

## 2018-11-04 NOTE — Progress Notes (Signed)
This visit was conducted in person.  BP 132/84   Pulse 81   Temp 98 F (36.7 C)   Ht 6' 0.5" (1.842 m)   Wt 229 lb 8 oz (104.1 kg)   SpO2 98%   BMI 30.70 kg/m    CC: CPE Subjective:    Patient ID: William Friedman, male    DOB: 01/08/1971, 48 y.o.   MRN: [id number]  HPI: William Friedman is a 48 y.o. male presenting on 11/04/2018 for Annual Exam   Daughter doing virtual school at home - goes to school at [employer].   Chronic insomnia - has tried OTC remedies. Bedtime is 11pm. Predominant sleep initiation insomnia.   Preventative: Flu shot - yearly Tetanus unsure ~2012  Seat belt use discussed Sunscreen use discussed. No changing moles on skin. Non smoker  Alcohol - few drinks a week Dentist -yearly  Eye exam - S/p lasik 2006. Wears contacts   Lives with wife and daughter, 1 dog  Occupation: Administrator - [employer]  Edu: BS  Activity:going to gym 3-4 times weekly (currently colsed due to pandemic), plays golf  Diet: overall better - avoiding junk food, good water, backed off soda      Relevant past medical, surgical, family and social history reviewed and updated as indicated. Interim medical history since our last visit reviewed. Allergies and medications reviewed and updated. Outpatient Medications Prior to Visit  Medication Sig Dispense Refill  . aspirin EC 81 MG tablet Take 81 mg by mouth every other day.    . Buprenorphine HCl-Naloxone HCl (SUBOXONE) 2-0.5 MG FILM Place 1 tablet under the tongue every other day.  0  . methocarbamol (ROBAXIN) 750 MG tablet TAKE 1 TABLET BY MOUTH THREE TIMES A DAY AS NEEDED FOR MUSCLE SPASMS 60 tablet 3  . Multiple Vitamins-Minerals (MULTIVITAMIN PO) Take 1 tablet by mouth daily.    Marland Kitchen zolpidem (AMBIEN) 10 MG tablet TAKE HALF TO ONE TABLET BY MOUTH AT BEDTIME AS NEEDED FOR SLEEP. 30 tablet 1  . amLODipine (NORVASC) 10 MG tablet TAKE 1 TABLET BY MOUTH DAILY 90 tablet 2  . atorvastatin (LIPITOR) 40 MG tablet TAKE 1 TABLET  BY MOUTH EVERY DAY 90 tablet 0  . fenofibrate (TRICOR) 145 MG tablet TAKE 1 TABLET BY MOUTH EVERY DAY 90 tablet 1   No facility-administered medications prior to visit.      Per HPI unless specifically indicated in ROS section below Review of Systems  Constitutional: Negative for activity change, appetite change, chills, fatigue, fever and unexpected weight change.  HENT: Negative for hearing loss.   Eyes: Negative for visual disturbance.  Respiratory: Negative for cough, chest tightness, shortness of breath and wheezing.   Cardiovascular: Negative for chest pain, palpitations and leg swelling.  Gastrointestinal: Negative for abdominal distention, abdominal pain, blood in stool, constipation, diarrhea, nausea and vomiting.  Genitourinary: Negative for difficulty urinating and hematuria.  Musculoskeletal: Negative for arthralgias, myalgias and neck pain.  Skin: Negative for rash.  Neurological: Negative for dizziness, seizures, syncope and headaches.  Hematological: Negative for adenopathy. Does not bruise/bleed easily.  Psychiatric/Behavioral: Negative for dysphoric mood. The patient is not nervous/anxious.    Objective:    BP 132/84   Pulse 81   Temp 98 F (36.7 C)   Ht 6' 0.5" (1.842 m)   Wt 229 lb 8 oz (104.1 kg)   SpO2 98%   BMI 30.70 kg/m   Wt Readings from Last 3 Encounters:  11/04/18 229 lb 8  oz (104.1 kg)  07/27/17 216 lb 4 oz (98.1 kg)  01/22/17 235 lb (106.6 kg)    Physical Exam Vitals signs and nursing note reviewed.  Constitutional:      General: He is not in acute distress.    Appearance: Normal appearance. He is well-developed. He is not ill-appearing.  HENT:     Head: Normocephalic and atraumatic.     Right Ear: Hearing, tympanic membrane, ear canal and external ear normal.     Left Ear: Hearing, tympanic membrane, ear canal and external ear normal.     Nose: Nose normal.     Mouth/Throat:     Mouth: Mucous membranes are dry.     Pharynx: Oropharynx is  clear. Uvula midline. No oropharyngeal exudate or posterior oropharyngeal erythema.  Eyes:     General: No scleral icterus.    Extraocular Movements: Extraocular movements intact.     Conjunctiva/sclera: Conjunctivae normal.     Pupils: Pupils are equal, round, and reactive to light.  Neck:     Musculoskeletal: Normal range of motion and neck supple.  Cardiovascular:     Rate and Rhythm: Normal rate and regular rhythm.     Pulses: Normal pulses.          Radial pulses are 2+ on the right side and 2+ on the left side.     Heart sounds: Normal heart sounds. No murmur.  Pulmonary:     Effort: Pulmonary effort is normal. No respiratory distress.     Breath sounds: Normal breath sounds. No wheezing, rhonchi or rales.  Abdominal:     General: Abdomen is flat. Bowel sounds are normal. There is no distension.     Palpations: Abdomen is soft. There is no mass.     Tenderness: There is no abdominal tenderness. There is no guarding or rebound.     Hernia: No hernia is present.  Musculoskeletal: Normal range of motion.     Right lower leg: No edema.     Left lower leg: No edema.  Lymphadenopathy:     Cervical: No cervical adenopathy.  Skin:    General: Skin is warm and dry.     Findings: No rash.  Neurological:     General: No focal deficit present.     Mental Status: He is alert and oriented to person, place, and time.     Comments: CN grossly intact, station and gait intact  Psychiatric:        Mood and Affect: Mood normal.        Behavior: Behavior normal.        Thought Content: Thought content normal.        Judgment: Judgment normal.       Results for orders placed or performed in visit on 10/28/18  Hemoglobin A1c  Result Value Ref Range   Hgb A1c MFr Bld 5.7 4.6 - 6.5 %  Comprehensive metabolic panel  Result Value Ref Range   Sodium 138 135 - 145 mEq/L   Potassium 4.5 3.5 - 5.1 mEq/L   Chloride 105 96 - 112 mEq/L   CO2 26 19 - 32 mEq/L   Glucose, Bld 96 70 - 99 mg/dL    BUN 19 6 - 23 mg/dL   Creatinine, Ser 6.43 0.40 - 1.50 mg/dL   Total Bilirubin 0.4 0.2 - 1.2 mg/dL   Alkaline Phosphatase 43 39 - 117 U/L   AST 19 0 - 37 U/L   ALT 20 0 - 53 U/L  Total Protein 7.1 6.0 - 8.3 g/dL   Albumin 4.8 3.5 - 5.2 g/dL   Calcium 9.6 8.4 - 08.610.5 mg/dL   GFR 57.8460.04 >69.62>60.00 mL/min  Lipid panel  Result Value Ref Range   Cholesterol 191 0 - 200 mg/dL   Triglycerides 95.299.0 0.0 - 149.0 mg/dL   HDL 84.1352.00 >24.40>39.00 mg/dL   VLDL 10.219.8 0.0 - 72.540.0 mg/dL   LDL Cholesterol 366119 (H) 0 - 99 mg/dL   Total CHOL/HDL Ratio 4    NonHDL 138.79   Microalbumin / creatinine urine ratio  Result Value Ref Range   Microalb, Ur <0.7 0.0 - 1.9 mg/dL   Creatinine,U 440.3147.2 mg/dL   Microalb Creat Ratio 0.5 0.0 - 30.0 mg/g   Assessment & Plan:   Problem List Items Addressed This Visit    Prediabetes    Again reviewed latest A1c with patient.       Obesity, Class I, BMI 30-34.9    Discussed healthy diet and lifestyle       HTN (hypertension)    Chronic, stable. Continue current regimen.       Relevant Medications   atorvastatin (LIPITOR) 40 MG tablet   fenofibrate (TRICOR) 145 MG tablet   amLODipine (NORVASC) 10 MG tablet   Health maintenance examination - Primary    Preventative protocols reviewed and updated unless pt declined. Discussed healthy diet and lifestyle.       Family history of early CAD    Continue aspirin/statin      Dyslipidemia    Chronic, stable. Continue atorvastatin and fenofibrate. Also manages with healthy diet/lifestyle changes.  The 10-year ASCVD risk score Denman George(Goff DC Montez HagemanJr., et al., 2013) is: 3.5%   Values used to calculate the score:     Age: [age] years     Sex: Male     Is Non-Hispanic African American: No     Diabetic: No     Tobacco smoker: No     Systolic Blood Pressure: 132 mmHg     Is BP treated: Yes     HDL Cholesterol: 52 mg/dL     Total Cholesterol: 191 mg/dL       Relevant Medications   atorvastatin (LIPITOR) 40 MG tablet   fenofibrate  (TRICOR) 145 MG tablet   Chronic pain syndrome    Continues seeing pain clinic regularly on suboxone.       Relevant Medications   traZODone (DESYREL) 50 MG tablet   Chronic insomnia    Discussed options including risks of long term [place] use - continue ambien for now. Reviewed sleep hygiene. Will trial trazodone hopeful to taper off non-benzo hypnotic.           Meds ordered this encounter  Medications  . atorvastatin (LIPITOR) 40 MG tablet    Sig: Take 1 tablet (40 mg total) by mouth daily.    Dispense:  90 tablet    Refill:  3  . fenofibrate (TRICOR) 145 MG tablet    Sig: Take 1 tablet (145 mg total) by mouth daily.    Dispense:  90 tablet    Refill:  3  . traZODone (DESYREL) 50 MG tablet    Sig: Take 0.5-1 tablets (25-50 mg total) by mouth at bedtime as needed for sleep.    Dispense:  30 tablet    Refill:  3  . amLODipine (NORVASC) 10 MG tablet    Sig: Take 1 tablet (10 mg total) by mouth daily.    Dispense:  90 tablet  Refill:  3   Orders Placed This Encounter  Procedures  . Flu Vaccine QUAD 6+ mos PF IM (Fluarix Quad PF)    Follow up plan: No follow-ups on file.  Eustaquio Boyden, MD

## 2018-11-04 NOTE — Assessment & Plan Note (Signed)
Discussed options including risks of long term [place] use - continue [place] for now. Reviewed sleep hygiene. Will trial trazodone hopeful to taper off non-benzo hypnotic.

## 2018-11-28 ENCOUNTER — Other Ambulatory Visit: Payer: Self-pay | Admitting: Family Medicine

## 2018-12-24 DIAGNOSIS — Z79891 Long term (current) use of opiate analgesic: Secondary | ICD-10-CM | POA: Diagnosis not present

## 2019-01-08 ENCOUNTER — Other Ambulatory Visit: Payer: Self-pay | Admitting: Family Medicine

## 2019-01-09 NOTE — Telephone Encounter (Signed)
ERx 

## 2019-02-01 ENCOUNTER — Other Ambulatory Visit: Payer: Self-pay | Admitting: Family Medicine

## 2019-02-28 DIAGNOSIS — Z79891 Long term (current) use of opiate analgesic: Secondary | ICD-10-CM | POA: Diagnosis not present

## 2019-03-26 ENCOUNTER — Other Ambulatory Visit: Payer: Self-pay | Admitting: Family Medicine

## 2019-03-26 NOTE — Telephone Encounter (Signed)
Name of Medication: Ambien Name of Pharmacy: [facility] Last Fill or Written Date and Quantity: 02/23/19, #30 Last Office Visit and Type: 11/04/18, CPE Next Office Visit and Type: 11/05/19, CPE Last Controlled Substance Agreement Date: none Last UDS: none

## 2019-03-28 NOTE — Telephone Encounter (Signed)
ERx 

## 2019-04-10 ENCOUNTER — Ambulatory Visit: Payer: Federal, State, Local not specified - PPO | Attending: Internal Medicine

## 2019-04-10 DIAGNOSIS — Z20822 Contact with and (suspected) exposure to covid-19: Secondary | ICD-10-CM | POA: Insufficient documentation

## 2019-04-11 LAB — NOVEL CORONAVIRUS, NAA: SARS-CoV-2, NAA: NOT DETECTED

## 2019-04-28 DIAGNOSIS — Z79891 Long term (current) use of opiate analgesic: Secondary | ICD-10-CM | POA: Diagnosis not present

## 2019-05-24 ENCOUNTER — Telehealth: Payer: Self-pay | Admitting: Family Medicine

## 2019-05-26 NOTE — Telephone Encounter (Signed)
Last filled 04-28-19 #60 Last OV 11-04-18 Next OV 11-05-19 [facility]

## 2019-05-28 NOTE — Telephone Encounter (Signed)
Noted  

## 2019-05-28 NOTE — Telephone Encounter (Signed)
Patient called to check on refill.  Patient said he'll be out of medication in a couple days.

## 2019-05-28 NOTE — Telephone Encounter (Signed)
I left a detailed message on patient's voice mail that rx was called in to pharmacy.

## 2019-06-15 ENCOUNTER — Other Ambulatory Visit: Payer: Self-pay | Admitting: Family Medicine

## 2019-06-16 NOTE — Telephone Encounter (Signed)
Name of Medication: Ambien Name of Pharmacy: [facility] Last Fill or Written Date and Quantity: 05/05/19, #30 Last Office Visit and Type: 11/04/18, CPE Next Office Visit and Type: 11/05/19, CPE Last Controlled Substance Agreement Date: none Last UDS: 05/01/19

## 2019-06-16 NOTE — Telephone Encounter (Signed)
ERx 

## 2019-06-26 DIAGNOSIS — Z79891 Long term (current) use of opiate analgesic: Secondary | ICD-10-CM | POA: Diagnosis not present

## 2019-08-25 DIAGNOSIS — Z79891 Long term (current) use of opiate analgesic: Secondary | ICD-10-CM | POA: Diagnosis not present

## 2019-09-18 ENCOUNTER — Other Ambulatory Visit: Payer: Self-pay | Admitting: Family Medicine

## 2019-09-19 NOTE — Telephone Encounter (Signed)
Name of Medication: Ambien Name of Pharmacy: [facility] Last Fill or Written Date and Quantity: 08/09/19, #30 Last Office Visit and Type: 11/04/18, CPE Next Office Visit and Type: 11/05/19, CPE Last Controlled Substance Agreement Date: none Last UDS: none  Robaxin last filled 08/10/19, #60

## 2019-09-19 NOTE — Telephone Encounter (Signed)
ERx 

## 2019-10-27 DIAGNOSIS — Z79891 Long term (current) use of opiate analgesic: Secondary | ICD-10-CM | POA: Diagnosis not present

## 2019-10-28 ENCOUNTER — Other Ambulatory Visit: Payer: Self-pay | Admitting: Family Medicine

## 2019-10-28 DIAGNOSIS — E785 Hyperlipidemia, unspecified: Secondary | ICD-10-CM

## 2019-10-28 DIAGNOSIS — I10 Essential (primary) hypertension: Secondary | ICD-10-CM

## 2019-10-28 DIAGNOSIS — Z1159 Encounter for screening for other viral diseases: Secondary | ICD-10-CM

## 2019-10-28 DIAGNOSIS — Z125 Encounter for screening for malignant neoplasm of prostate: Secondary | ICD-10-CM

## 2019-10-28 DIAGNOSIS — R7303 Prediabetes: Secondary | ICD-10-CM

## 2019-10-29 ENCOUNTER — Other Ambulatory Visit (INDEPENDENT_AMBULATORY_CARE_PROVIDER_SITE_OTHER): Payer: Federal, State, Local not specified - PPO

## 2019-10-29 ENCOUNTER — Other Ambulatory Visit: Payer: Self-pay

## 2019-10-29 DIAGNOSIS — I10 Essential (primary) hypertension: Secondary | ICD-10-CM

## 2019-10-29 DIAGNOSIS — R7303 Prediabetes: Secondary | ICD-10-CM

## 2019-10-29 DIAGNOSIS — Z1159 Encounter for screening for other viral diseases: Secondary | ICD-10-CM

## 2019-10-29 DIAGNOSIS — Z125 Encounter for screening for malignant neoplasm of prostate: Secondary | ICD-10-CM | POA: Diagnosis not present

## 2019-10-29 DIAGNOSIS — E785 Hyperlipidemia, unspecified: Secondary | ICD-10-CM | POA: Diagnosis not present

## 2019-10-29 LAB — COMPREHENSIVE METABOLIC PANEL
ALT: 19 U/L (ref 0–53)
AST: 16 U/L (ref 0–37)
Albumin: 4.7 g/dL (ref 3.5–5.2)
Alkaline Phosphatase: 45 U/L (ref 39–117)
BUN: 21 mg/dL (ref 6–23)
CO2: 26 mEq/L (ref 19–32)
Calcium: 9.6 mg/dL (ref 8.4–10.5)
Chloride: 107 mEq/L (ref 96–112)
Creatinine, Ser: 1.32 mg/dL (ref 0.40–1.50)
GFR: 57.2 mL/min — ABNORMAL LOW (ref >60.00)
Glucose, Bld: 117 mg/dL — ABNORMAL HIGH (ref 70–99)
Potassium: 4.1 mEq/L (ref 3.5–5.1)
Sodium: 139 mEq/L (ref 135–145)
Total Bilirubin: 0.3 mg/dL (ref 0.2–1.2)
Total Protein: 7.1 g/dL (ref 6.0–8.3)

## 2019-10-29 LAB — PSA: PSA: 0.49 ng/mL (ref 0.10–4.00)

## 2019-10-29 LAB — LIPID PANEL
Cholesterol: 175 mg/dL (ref 0–200)
HDL: 51.7 mg/dL (ref >39.00)
LDL Cholesterol: 102 mg/dL — ABNORMAL HIGH (ref 0–99)
NonHDL: 123
Total CHOL/HDL Ratio: 3
Triglycerides: 104 mg/dL (ref 0.0–149.0)
VLDL: 20.8 mg/dL (ref 0.0–40.0)

## 2019-10-29 LAB — MICROALBUMIN / CREATININE URINE RATIO
Creatinine,U: 70.8 mg/dL
Microalb Creat Ratio: 1 mg/g (ref 0.0–30.0)
Microalb, Ur: <0.7 mg/dL (ref 0.0–1.9)

## 2019-10-29 LAB — HEMOGLOBIN A1C: Hgb A1c MFr Bld: 5.8 % (ref 4.6–6.5)

## 2019-10-30 LAB — HEPATITIS C ANTIBODY
Hepatitis C Ab: NONREACTIVE
SIGNAL TO CUT-OFF: 0.01 (ref <1.00)

## 2019-11-05 ENCOUNTER — Encounter: Payer: Federal, State, Local not specified - PPO | Admitting: Family Medicine

## 2019-11-08 ENCOUNTER — Other Ambulatory Visit: Payer: Self-pay | Admitting: Family Medicine

## 2019-11-13 ENCOUNTER — Other Ambulatory Visit: Payer: Self-pay | Admitting: Family Medicine

## 2019-12-16 ENCOUNTER — Other Ambulatory Visit: Payer: Self-pay | Admitting: Family Medicine

## 2019-12-19 ENCOUNTER — Other Ambulatory Visit: Payer: Self-pay | Admitting: Family Medicine

## 2019-12-30 ENCOUNTER — Ambulatory Visit (INDEPENDENT_AMBULATORY_CARE_PROVIDER_SITE_OTHER): Payer: Federal, State, Local not specified - PPO | Admitting: Family Medicine

## 2019-12-30 ENCOUNTER — Other Ambulatory Visit: Payer: Self-pay

## 2019-12-30 ENCOUNTER — Encounter: Payer: Self-pay | Admitting: Family Medicine

## 2019-12-30 VITALS — BP 132/80 | HR 66 | Temp 98.4°F | Ht 73.0 in | Wt 230.4 lb

## 2019-12-30 DIAGNOSIS — E669 Obesity, unspecified: Secondary | ICD-10-CM

## 2019-12-30 DIAGNOSIS — E785 Hyperlipidemia, unspecified: Secondary | ICD-10-CM

## 2019-12-30 DIAGNOSIS — Z0000 Encounter for general adult medical examination without abnormal findings: Secondary | ICD-10-CM | POA: Diagnosis not present

## 2019-12-30 DIAGNOSIS — R7303 Prediabetes: Secondary | ICD-10-CM

## 2019-12-30 DIAGNOSIS — F5104 Psychophysiologic insomnia: Secondary | ICD-10-CM

## 2019-12-30 DIAGNOSIS — I10 Essential (primary) hypertension: Secondary | ICD-10-CM

## 2019-12-30 DIAGNOSIS — Z8249 Family history of ischemic heart disease and other diseases of the circulatory system: Secondary | ICD-10-CM | POA: Diagnosis not present

## 2019-12-30 DIAGNOSIS — G894 Chronic pain syndrome: Secondary | ICD-10-CM

## 2019-12-30 DIAGNOSIS — Z1211 Encounter for screening for malignant neoplasm of colon: Secondary | ICD-10-CM | POA: Diagnosis not present

## 2019-12-30 DIAGNOSIS — Z23 Encounter for immunization: Secondary | ICD-10-CM | POA: Diagnosis not present

## 2019-12-30 MED ORDER — METHOCARBAMOL 750 MG PO TABS
ORAL_TABLET | ORAL | 3 refills | Status: DC
Start: 2019-12-30 — End: 2020-04-22

## 2019-12-30 MED ORDER — FENOFIBRATE 145 MG PO TABS
145.0000 mg | ORAL_TABLET | Freq: Every day | ORAL | 3 refills | Status: DC
Start: 2019-12-30 — End: 2021-01-03

## 2019-12-30 MED ORDER — AMLODIPINE BESYLATE 10 MG PO TABS
10.0000 mg | ORAL_TABLET | Freq: Every day | ORAL | 3 refills | Status: DC
Start: 2019-12-30 — End: 2021-01-03

## 2019-12-30 MED ORDER — ZOLPIDEM TARTRATE 10 MG PO TABS
5.0000 mg | ORAL_TABLET | Freq: Every evening | ORAL | 1 refills | Status: DC | PRN
Start: 2019-12-30 — End: 2020-03-22

## 2019-12-30 MED ORDER — ATORVASTATIN CALCIUM 40 MG PO TABS
40.0000 mg | ORAL_TABLET | Freq: Every day | ORAL | 3 refills | Status: DC
Start: 2019-12-30 — End: 2021-01-03

## 2019-12-30 NOTE — Assessment & Plan Note (Signed)
Continues aspirin, statin.  

## 2019-12-30 NOTE — Progress Notes (Signed)
Patient ID: William Friedman, male    DOB: Mar 10, 1970, 49 y.o.   MRN: [id number]  This visit was conducted in person.  BP 132/80 (BP Location: Left Arm, Patient Position: Sitting, Cuff Size: Large)   Pulse 66   Temp 98.4 F (36.9 C) (Temporal)   Ht _0  (1.854 m)   Wt 230 lb 6 oz (104.5 kg)   SpO2 97%   BMI 30.39 kg/m    CC: CPE Subjective:   HPI: William Friedman is a 49 y.o. male presenting on 12/30/2019 for Annual Exam   Trazodone ineffective for insomnia - continues ambien.  Chronic pain followed by [facility] psych counseling pain clinic on suboxone  Preventative: Colon cancer screening - discussed, would like iFOB Prostate cancer screening - discussed. Will check PSA. Asxs.  Flu shot - yearly [place] 03/2019 x2.  Tetanus unsure ~2012  Shingrix - discussed  Seat belt use discussed  Sunscreen use discussed. No changing moles on skin. Non smoker  Alcohol - few mixed drinks a week  Dentist - yearly  Eye exam - s/p lasik 2006. wears contacts.   Lives with wife and daughter, 1 dog  Occupation: Administrator - [employer]  Edu: BS  Activity: walking 3-4 times a week, golfing Diet: overall better - avoiding junk food, good water, backed off soda, more fish and fruits/vegetables     Relevant past medical, surgical, family and social history reviewed and updated as indicated. Interim medical history since our last visit reviewed. Allergies and medications reviewed and updated. Outpatient Medications Prior to Visit  Medication Sig Dispense Refill  . aspirin EC 81 MG tablet Take 81 mg by mouth every other day.    . Buprenorphine HCl-Naloxone HCl (SUBOXONE) 2-0.5 MG FILM Place 1 tablet under the tongue every other day.  0  . Multiple Vitamins-Minerals (MULTIVITAMIN PO) Take 1 tablet by mouth daily.    Marland Kitchen amLODipine (NORVASC) 10 MG tablet TAKE 1 TABLET BY MOUTH EVERY DAY 90 tablet 0  . atorvastatin (LIPITOR) 40 MG tablet TAKE 1 TABLET BY MOUTH EVERY DAY 90  tablet 3  . fenofibrate (TRICOR) 145 MG tablet TAKE 1 TABLET BY MOUTH EVERY DAY 90 tablet 0  . methocarbamol (ROBAXIN) 750 MG tablet TAKE 1 TABLET BY MOUTH THREE TIMES A DAY AS NEEDED FOR MUSCLE SPASMS 60 tablet 3  . traZODone (DESYREL) 50 MG tablet TAKE 0.5-1 TABLETS (25-50 MG TOTAL) BY MOUTH AT BEDTIME AS NEEDED FOR SLEEP. 90 tablet 2  . zolpidem (AMBIEN) 10 MG tablet TAKE 1/2 TO 1 TABLET BY MOUTH AT BEDTIME AS NEEDED FOR SLEEP 30 tablet 1   No facility-administered medications prior to visit.     Per HPI unless specifically indicated in ROS section below Review of Systems  Constitutional: Negative for activity change, appetite change, chills, fatigue, fever and unexpected weight change.  HENT: Negative for hearing loss.   Eyes: Negative for visual disturbance.  Respiratory: Negative for cough, chest tightness, shortness of breath and wheezing.   Cardiovascular: Negative for chest pain, palpitations and leg swelling.  Gastrointestinal: Negative for abdominal distention, abdominal pain, blood in stool, constipation, diarrhea, nausea and vomiting.  Genitourinary: Negative for difficulty urinating and hematuria.  Musculoskeletal: Negative for arthralgias, myalgias and neck pain.  Skin: Negative for rash.  Neurological: Negative for dizziness, seizures, syncope and headaches.  Hematological: Negative for adenopathy. Does not bruise/bleed easily.  Psychiatric/Behavioral: Negative for dysphoric mood. The patient is not nervous/anxious.    Objective:  BP 132/80 (  BP Location: Left Arm, Patient Position: Sitting, Cuff Size: Large)   Pulse 66   Temp 98.4 F (36.9 C) (Temporal)   Ht _0  (1.854 m)   Wt 230 lb 6 oz (104.5 kg)   SpO2 97%   BMI 30.39 kg/m   Wt Readings from Last 3 Encounters:  12/30/19 230 lb 6 oz (104.5 kg)  11/04/18 229 lb 8 oz (104.1 kg)  07/27/17 216 lb 4 oz (98.1 kg)      Physical Exam Vitals and nursing note reviewed.  Constitutional:      General: He is not in  acute distress.    Appearance: Normal appearance. He is well-developed. He is not ill-appearing.  HENT:     Head: Normocephalic and atraumatic.     Right Ear: Hearing, tympanic membrane, ear canal and external ear normal.     Left Ear: Hearing, tympanic membrane, ear canal and external ear normal.     Mouth/Throat:     Pharynx: Uvula midline.  Eyes:     General: No scleral icterus.    Extraocular Movements: Extraocular movements intact.     Conjunctiva/sclera: Conjunctivae normal.     Pupils: Pupils are equal, round, and reactive to light.  Cardiovascular:     Rate and Rhythm: Normal rate and regular rhythm.     Pulses: Normal pulses.          Radial pulses are 2+ on the right side and 2+ on the left side.     Heart sounds: Normal heart sounds. No murmur heard.   Pulmonary:     Effort: Pulmonary effort is normal. No respiratory distress.     Breath sounds: Normal breath sounds. No wheezing, rhonchi or rales.  Abdominal:     General: Abdomen is flat. Bowel sounds are normal. There is no distension.     Palpations: Abdomen is soft. There is no mass.     Tenderness: There is no abdominal tenderness. There is no guarding or rebound.     Hernia: No hernia is present.  Musculoskeletal:        General: Normal range of motion.     Cervical back: Normal range of motion and neck supple.     Right lower leg: No edema.     Left lower leg: No edema.  Lymphadenopathy:     Cervical: No cervical adenopathy.  Skin:    General: Skin is warm and dry.     Findings: No rash.  Neurological:     General: No focal deficit present.     Mental Status: He is alert and oriented to person, place, and time.     Comments: CN grossly intact, station and gait intact  Psychiatric:        Mood and Affect: Mood normal.        Behavior: Behavior normal.        Thought Content: Thought content normal.        Judgment: Judgment normal.       Results for orders placed or performed in visit on 10/29/19    Hepatitis C antibody  Result Value Ref Range   Hepatitis C Ab NON-REACTIVE NON-REACTI   SIGNAL TO CUT-OFF 0.01 <1.00  Microalbumin / creatinine urine ratio  Result Value Ref Range   Microalb, Ur <0.7 0.0 - 1.9 mg/dL   Creatinine,U 70.8 mg/dL   Microalb Creat Ratio 1.0 0.0 - 30.0 mg/g  PSA  Result Value Ref Range   PSA 0.49 0.10 - 4.00 ng/mL  Hemoglobin  A1c  Result Value Ref Range   Hgb A1c MFr Bld 5.8 4.6 - 6.5 %  Comprehensive metabolic panel  Result Value Ref Range   Sodium 139 135 - 145 mEq/L   Potassium 4.1 3.5 - 5.1 mEq/L   Chloride 107 96 - 112 mEq/L   CO2 26 19 - 32 mEq/L   Glucose, Bld 117 (H) 70 - 99 mg/dL   BUN 21 6 - 23 mg/dL   Creatinine, Ser 1.32 0.40 - 1.50 mg/dL   Total Bilirubin 0.3 0.2 - 1.2 mg/dL   Alkaline Phosphatase 45 39 - 117 U/L   AST 16 0 - 37 U/L   ALT 19 0 - 53 U/L   Total Protein 7.1 6.0 - 8.3 g/dL   Albumin 4.7 3.5 - 5.2 g/dL   GFR 57.20 (L) >60.00 mL/min   Calcium 9.6 8.4 - 10.5 mg/dL  Lipid panel  Result Value Ref Range   Cholesterol 175 0 - 200 mg/dL   Triglycerides 104.0 0 - 149 mg/dL   HDL 51.70 >39.00 mg/dL   VLDL 20.8 0.0 - 40.0 mg/dL   LDL Cholesterol 102 (H) 0 - 99 mg/dL   Total CHOL/HDL Ratio 3    NonHDL 123.00    Assessment & Plan:  This visit occurred during the SARS-CoV-2 public health emergency.  Safety protocols were in place, including screening questions prior to the visit, additional usage of staff PPE, and extensive cleaning of exam room while observing appropriate contact time as indicated for disinfecting solutions.   Problem List Items Addressed This Visit    Prediabetes    Aware to limit added sugars/carbs.       Obesity, Class I, BMI 30-34.9    Encouraged healthy diet and lifestyle choices to affect sustainable weight loss.       HTN (hypertension)    Chronic, stable on amlodipine. Continue.       Relevant Medications   amLODipine (NORVASC) 10 MG tablet   atorvastatin (LIPITOR) 40 MG tablet    fenofibrate (TRICOR) 145 MG tablet   Health maintenance examination - Primary    Preventative protocols reviewed and updated unless pt declined. Discussed healthy diet and lifestyle.       Family history of early CAD    Continues aspirin, statin.       Dyslipidemia    Chronic, stable on atorvastatin and fenofibrate - continue. The 10-year ASCVD risk score Mikey Bussing DC Brooke Bonito., et al., 2013) is: 3.8%   Values used to calculate the score:     Age: 87 years     Sex: Male     Is Non-Hispanic African American: No     Diabetic: No     Tobacco smoker: No     Systolic Blood Pressure: 456 mmHg     Is BP treated: Yes     HDL Cholesterol: 51.7 mg/dL     Total Cholesterol: 175 mg/dL       Relevant Medications   atorvastatin (LIPITOR) 40 MG tablet   fenofibrate (TRICOR) 145 MG tablet   Chronic pain syndrome    Followed by pain clinic, on suboxone.       Relevant Medications   methocarbamol (ROBAXIN) 750 MG tablet   Chronic insomnia    Continue ambien 33m nightly. Trazodone ineffective       Other Visit Diagnoses    Need for influenza vaccination       Relevant Orders   Flu Vaccine QUAD 36+ mos IM (Completed)   Special screening for  malignant neoplasms, colon       Relevant Orders   Fecal occult blood, imunochemical       Meds ordered this encounter  Medications  . methocarbamol (ROBAXIN) 750 MG tablet    Sig: TAKE 1 TABLET BY MOUTH THREE TIMES A DAY AS NEEDED FOR MUSCLE SPASMS    Dispense:  60 tablet    Refill:  3  . zolpidem (AMBIEN) 10 MG tablet    Sig: Take 0.5-1 tablets (5-10 mg total) by mouth at bedtime as needed. for sleep    Dispense:  30 tablet    Refill:  1    Not to exceed 2 additional fills before 12/13/2019  . amLODipine (NORVASC) 10 MG tablet    Sig: Take 1 tablet (10 mg total) by mouth daily.    Dispense:  90 tablet    Refill:  3  . atorvastatin (LIPITOR) 40 MG tablet    Sig: Take 1 tablet (40 mg total) by mouth daily.    Dispense:  90 tablet    Refill:   3  . fenofibrate (TRICOR) 145 MG tablet    Sig: Take 1 tablet (145 mg total) by mouth daily.    Dispense:  90 tablet    Refill:  3   Orders Placed This Encounter  Procedures  . Fecal occult blood, imunochemical    Standing Status:   Future    Standing Expiration Date:   12/29/2020  . Flu Vaccine QUAD 36+ mos IM    Patient instructions: Flu shot today  Pass by lab to pick up stool kit.  We will watch kidney function - work on increased water to keep kidneys well hydrated.  Continue regular walking routine.  Return as needed or in 1 year for next physical.   Follow up plan: Return in about 1 year (around 12/29/2020) for annual exam, prior fasting for blood work.  Ria Bush, MD

## 2019-12-30 NOTE — Assessment & Plan Note (Signed)
Encouraged healthy diet and lifestyle choices to affect sustainable weight loss.  ?

## 2019-12-30 NOTE — Assessment & Plan Note (Signed)
Aware to limit added sugars/carbs.

## 2019-12-30 NOTE — Assessment & Plan Note (Signed)
Preventative protocols reviewed and updated unless pt declined. Discussed healthy diet and lifestyle.  

## 2019-12-30 NOTE — Assessment & Plan Note (Signed)
Continue ambien 5mg  nightly. Trazodone ineffective

## 2019-12-30 NOTE — Assessment & Plan Note (Signed)
Chronic, stable on atorvastatin and fenofibrate - continue. The 10-year ASCVD risk score Denman George DC Montez Hageman., et al., 2013) is: 3.8%   Values used to calculate the score:     Age: 49 years     Sex: Male     Is Non-Hispanic African American: No     Diabetic: No     Tobacco smoker: No     Systolic Blood Pressure: 132 mmHg     Is BP treated: Yes     HDL Cholesterol: 51.7 mg/dL     Total Cholesterol: 175 mg/dL

## 2019-12-30 NOTE — Patient Instructions (Addendum)
Flu shot today  Pass by lab to pick up stool kit.  We will watch kidney function - work on increased water to keep kidneys well hydrated.  Continue regular walking routine.  Return as needed or in 1 year for next physical.   Health Maintenance, Male Adopting a healthy lifestyle and getting preventive care are important in promoting health and wellness. Ask your health care provider about:  The right schedule for you to have regular tests and exams.  Things you can do on your own to prevent diseases and keep yourself healthy. What should I know about diet, weight, and exercise? Eat a healthy diet   Eat a diet that includes plenty of vegetables, fruits, low-fat dairy products, and lean protein.  Do not eat a lot of foods that are high in solid fats, added sugars, or sodium. Maintain a healthy weight Body mass index (BMI) is a measurement that can be used to identify possible weight problems. It estimates body fat based on height and weight. Your health care provider can help determine your BMI and help you achieve or maintain a healthy weight. Get regular exercise Get regular exercise. This is one of the most important things you can do for your health. Most adults should:  Exercise for at least 150 minutes each week. The exercise should increase your heart rate and make you sweat (moderate-intensity exercise).  Do strengthening exercises at least twice a week. This is in addition to the moderate-intensity exercise.  Spend less time sitting. Even light physical activity can be beneficial. Watch cholesterol and blood lipids Have your blood tested for lipids and cholesterol at 51 years of age, then have this test every 5 years. You may need to have your cholesterol levels checked more often if:  Your lipid or cholesterol levels are high.  You are older than 51 years of age.  You are at high risk for heart disease. What should I know about cancer screening? Many types of cancers can  be detected early and may often be prevented. Depending on your health history and family history, you may need to have cancer screening at various ages. This may include screening for:  Colorectal cancer.  Prostate cancer.  Skin cancer.  Lung cancer. What should I know about heart disease, diabetes, and high blood pressure? Blood pressure and heart disease  High blood pressure causes heart disease and increases the risk of stroke. This is more likely to develop in people who have high blood pressure readings, are of African descent, or are overweight.  Talk with your health care provider about your target blood pressure readings.  Have your blood pressure checked: ? Every 3-5 years if you are 75-26 years of age. ? Every year if you are 60 years old or older.  If you are between the ages of 19 and 10 and are a current or former smoker, ask your health care provider if you should have a one-time screening for abdominal aortic aneurysm (AAA). Diabetes Have regular diabetes screenings. This checks your fasting blood sugar level. Have the screening done:  Once every three years after age 50 if you are at a normal weight and have a low risk for diabetes.  More often and at a younger age if you are overweight or have a high risk for diabetes. What should I know about preventing infection? Hepatitis B If you have a higher risk for hepatitis B, you should be screened for this virus. Talk with your health care provider  to find out if you are at risk for hepatitis B infection. Hepatitis C Blood testing is recommended for:  Everyone born from [birth year] through 1967.  Anyone with known risk factors for hepatitis C. Sexually transmitted infections (STIs)  You should be screened each year for STIs, including gonorrhea and chlamydia, if: ? You are sexually active and are younger than 51 years of age. ? You are older than 51 years of age and your health care provider tells you that you are at risk  for this type of infection. ? Your sexual activity has changed since you were last screened, and you are at increased risk for chlamydia or gonorrhea. Ask your health care provider if you are at risk.  Ask your health care provider about whether you are at high risk for HIV. Your health care provider may recommend a prescription medicine to help prevent HIV infection. If you choose to take medicine to prevent HIV, you should first get tested for HIV. You should then be tested every 3 months for as long as you are taking the medicine. Follow these instructions at home: Lifestyle  Do not use any products that contain nicotine or tobacco, such as cigarettes, e-cigarettes, and chewing tobacco. If you need help quitting, ask your health care provider.  Do not use street drugs.  Do not share needles.  Ask your health care provider for help if you need support or information about quitting drugs. Alcohol use  Do not drink alcohol if your health care provider tells you not to drink.  If you drink alcohol: ? Limit how much you have to 0-2 drinks a day. ? Be aware of how much alcohol is in your drink. In the [place] one drink equals one 12 oz bottle of beer (355 mL), one 5 oz glass of wine (148 mL), or one 1 oz glass of hard liquor (44 mL). General instructions  Schedule regular health, dental, and eye exams.  Stay current with your vaccines.  Tell your health care provider if: ? You often feel depressed. ? You have ever been abused or do not feel safe at home. Summary  Adopting a healthy lifestyle and getting preventive care are important in promoting health and wellness.  Follow your health care provider's instructions about healthy diet, exercising, and getting tested or screened for diseases.  Follow your health care provider's instructions on monitoring your cholesterol and blood pressure. This information is not intended to replace advice given to you by your health care provider.  Make sure you discuss any questions you have with your health care provider. Document Revised: 01/16/2018 Document Reviewed: 01/16/2018 Elsevier Patient Education  2020 [employer].

## 2019-12-30 NOTE — Assessment & Plan Note (Signed)
Followed by pain clinic, on suboxone.

## 2019-12-30 NOTE — Assessment & Plan Note (Addendum)
Chronic, stable on amlodipine. Continue.

## 2020-01-05 ENCOUNTER — Other Ambulatory Visit (INDEPENDENT_AMBULATORY_CARE_PROVIDER_SITE_OTHER): Payer: Federal, State, Local not specified - PPO

## 2020-01-05 DIAGNOSIS — Z1211 Encounter for screening for malignant neoplasm of colon: Secondary | ICD-10-CM | POA: Diagnosis not present

## 2020-01-05 LAB — FECAL OCCULT BLOOD, GUAIAC: Fecal Occult Blood: NEGATIVE

## 2020-01-05 LAB — FECAL OCCULT BLOOD, IMMUNOCHEMICAL: Fecal Occult Bld: NEGATIVE

## 2020-01-06 ENCOUNTER — Encounter: Payer: Self-pay | Admitting: Family Medicine

## 2020-01-06 DIAGNOSIS — Z79899 Other long term (current) drug therapy: Secondary | ICD-10-CM | POA: Diagnosis not present

## 2020-01-06 DIAGNOSIS — Z0289 Encounter for other administrative examinations: Secondary | ICD-10-CM | POA: Diagnosis not present

## 2020-01-06 DIAGNOSIS — Z79891 Long term (current) use of opiate analgesic: Secondary | ICD-10-CM | POA: Diagnosis not present

## 2020-01-06 DIAGNOSIS — F1120 Opioid dependence, uncomplicated: Secondary | ICD-10-CM | POA: Diagnosis not present

## 2020-01-22 ENCOUNTER — Telehealth: Payer: Self-pay

## 2020-01-22 DIAGNOSIS — Z20822 Contact with and (suspected) exposure to covid-19: Secondary | ICD-10-CM | POA: Diagnosis not present

## 2020-01-22 DIAGNOSIS — Z03818 Encounter for observation for suspected exposure to other biological agents ruled out: Secondary | ICD-10-CM | POA: Diagnosis not present

## 2020-01-22 DIAGNOSIS — U071 COVID-19: Secondary | ICD-10-CM | POA: Diagnosis not present

## 2020-01-22 NOTE — Telephone Encounter (Signed)
I spoke with pts wife; pt symptoms started on 01/18/20 pt has dry cough; no SOB and no CP.pt feels warm but has not taken temp. No weakness or dizziness. No chills, and no H/A. Head congestion; runny nose; no wheezing and no chest congestion. Slight S/T and no vomiting or diarrhea. Almost no taste but pt can smell. No distress in breathing. Pt does not think he needs to go to UC or ED and does not want to schedule virtual visit at this time; pt will see how he feels in the morning and cb if needed. UC & ED precautions given and pt voiced understanding. Pt will drink plenty of liquids, rest, take tylenol for fever and will self quarantine 10 days. Sending note to Dr Reece Agar.

## 2020-01-22 NOTE — Telephone Encounter (Signed)
[facility] - Client TELEPHONE ADVICE RECORD [facility] Patient Name: William Friedman Gender: Male DOB: 03-Oct-1970 Age: 49 Y 2 M 1 D Return Phone Number: [phone] (Primary), [phone] (Secondary) Address[place] [address] Client [facility] - Client Client Site [facility] Physician Ria Bush - MD Contact Type Call Who Is Calling Patient / Member / Family / Caregiver Call Type Triage / Clinical Relationship To Patient Self Return Phone Number [phone] (Secondary) Chief Complaint Cough Reason for Call Symptomatic / Request for Health Information Initial Comment Caller states William Friedman. Office is wanting pt. to be triaged. Wife states her husband c/o flu like s/s since Sunday, worse every day, warm to touch, congestion,stomach noises/gas, redness in face and coughing. Translation No Nurse Assessment Nurse: Chestine Spore, RN, [place] Date/Time (Eastern Time): 01/22/2020 11:11:10 AM Confirm and document reason for call. If symptomatic, describe symptoms. ---caller states having a cough. congestion in nose. unknown temp. warm to touch. achy. covid-19 test done today. positive. s/s since sunday Does the patient have any new or worsening symptoms? ---Yes Will a triage be completed? ---Yes Related visit to physician within the last 2 weeks? ---No Does the PT have any chronic conditions? (i.e. diabetes, asthma, this includes High risk factors for pregnancy, etc.) ---No Is this a behavioral health or substance abuse call? ---No Guidelines Guideline Title Affirmed Question Affirmed Notes Nurse Date/Time (Centre Time) COVID-19 - Diagnosed or Suspected [1] COVID-19 diagnosed by positive lab test (e.g., PCR, rapid selftest kit) AND [2] mild symptoms (e.g., cough, fever, others) AND [6] no complications or SOB Matherly, RN,  [place] 01/22/2020 11:14:40 AM Disp. Time [place] Ghazi Time) Disposition Final User 01/22/2020 11:34:10 AM Home Care Yes Matherly, RN, [place] PLEASE NOTE: All timestamps contained within this report are represented as [place] Standard Time. CONFIDENTIALTY NOTICE: This fax transmission is intended only for the addressee. It contains information that is legally privileged, confidential or otherwise protected from use or disclosure. If you are not the intended recipient, you are strictly prohibited from reviewing, disclosing, copying using or disseminating any of this information or taking any action in reliance on or regarding this information. If you have received this fax in error, please notify us immediately by telephone so that we can arrange for its return to [place]. Phone: [phone], Toll-Free: [phone], Fax: [phone] Page: 2 of 2 Call Id: [id number] [place] Disagree/Comply Comply Caller Understands Yes PreDisposition Call Doctor Care Advice Given Per Guideline GENERAL CARE ADVICE FOR COVID-19 SYMPTOMS: * The treatment is the same whether you have COVID-19, influenza or some other respiratory virus. * Feeling dehydrated: Drink extra liquids. If the air in your home is dry, use a humidifier. * Fever: For fever over 101 F (38.3 C), take acetaminophen every 4 to 6 hours (Adults 650 mg) OR ibuprofen every 6 to 8 hours (Adults 400 mg). Before taking any medicine, read all the instructions on the package. Do not take aspirin unless your doctor has prescribed it for you. PAIN AND FEVER MEDICINES: * For pain or fever relief, take either acetaminophen or ibuprofen. CALL BACK IF: * Fever over 103 F (39.4 C) * Fever lasts over 3 days * Fever returns after being gone for 24 hours * Chest pain or difficulty breathing occurs * You become worse CARE ADVICE given per COVID-19 - DIAGNOSED OR SUSPECTED (Adult) guideline. After Care Instructions Given Call Event Type User Date / Time  Description Education document  email Chestine Spore, RN, [place] 01/22/2020 11:34:01 AM COVID-19 Diagnosed or Suspected Education document email Chestine Spore, RN, [place] 01/22/2020 11:34:01 AM COVID-19 Bridget Hartshorn Education document email Chestine Spore, RN, [place] 01/22/2020 11:34:01 AM COVID-19 or Influenza - How to Tell Education document email Chestine Spore, [place], [place] 01/22/2020 11:34:01 AM What To Do If You Are Sick With COVID-19_English

## 2020-01-23 NOTE — Telephone Encounter (Signed)
Pt added to COVID Call Log.

## 2020-01-23 NOTE — Telephone Encounter (Signed)
First day of symptoms 01/18/2020.  plz call this afternoon for update on symptoms and place on COVID call list for next week.  I don't know if he will qualify for mAb infusion treatment but please report his demographics to infusion team.

## 2020-01-26 NOTE — Telephone Encounter (Signed)
Spoke with pt asking for an update.  States he actually feels better today.  Just a little nasal congestion.  But no more fever, cough, body aches, etc.

## 2020-01-26 NOTE — Telephone Encounter (Signed)
Noted  

## 2020-01-26 NOTE — Telephone Encounter (Signed)
plz call one more time on Wednesday for update.  See wife's mychart message over weekend.

## 2020-01-28 NOTE — Telephone Encounter (Signed)
Lvm asking pt to call back.  Need to get update on sxs.  

## 2020-01-29 NOTE — Telephone Encounter (Addendum)
Lvm (on cell #) asking pt to call back.  Home vm is full.  Need to get update on sxs.  

## 2020-02-02 DIAGNOSIS — U071 COVID-19: Secondary | ICD-10-CM | POA: Diagnosis not present

## 2020-02-02 DIAGNOSIS — Z03818 Encounter for observation for suspected exposure to other biological agents ruled out: Secondary | ICD-10-CM | POA: Diagnosis not present

## 2020-02-02 DIAGNOSIS — Z20822 Contact with and (suspected) exposure to covid-19: Secondary | ICD-10-CM | POA: Diagnosis not present

## 2020-02-02 NOTE — Telephone Encounter (Signed)
Lvm (on cell #) asking pt to call back.  Home vm is full.  Need to get update on sxs.  

## 2020-02-03 NOTE — Telephone Encounter (Signed)
Lvm (on cell #) asking pt to call back.  Home vm is full.  Need to get update on sxs.

## 2020-02-04 DIAGNOSIS — Z1152 Encounter for screening for COVID-19: Secondary | ICD-10-CM | POA: Diagnosis not present

## 2020-02-04 DIAGNOSIS — Z03818 Encounter for observation for suspected exposure to other biological agents ruled out: Secondary | ICD-10-CM | POA: Diagnosis not present

## 2020-02-04 DIAGNOSIS — Z1159 Encounter for screening for other viral diseases: Secondary | ICD-10-CM | POA: Diagnosis not present

## 2020-02-04 NOTE — Telephone Encounter (Signed)
Lvm (on cell #) asking pt to call back.  Home vm is full.  Need to get update on sxs.   Also, sent a MyChart message.

## 2020-02-07 HISTORY — PX: KNEE CARTILAGE SURGERY: SHX688

## 2020-03-04 DIAGNOSIS — F1120 Opioid dependence, uncomplicated: Secondary | ICD-10-CM | POA: Diagnosis not present

## 2020-03-04 DIAGNOSIS — Z79891 Long term (current) use of opiate analgesic: Secondary | ICD-10-CM | POA: Diagnosis not present

## 2020-03-04 DIAGNOSIS — Z0289 Encounter for other administrative examinations: Secondary | ICD-10-CM | POA: Diagnosis not present

## 2020-03-04 DIAGNOSIS — Z79899 Other long term (current) drug therapy: Secondary | ICD-10-CM | POA: Diagnosis not present

## 2020-03-20 ENCOUNTER — Other Ambulatory Visit: Payer: Self-pay | Admitting: Family Medicine

## 2020-03-22 NOTE — Telephone Encounter (Signed)
ERx 

## 2020-04-06 DIAGNOSIS — F1120 Opioid dependence, uncomplicated: Secondary | ICD-10-CM | POA: Diagnosis not present

## 2020-04-06 DIAGNOSIS — Z79899 Other long term (current) drug therapy: Secondary | ICD-10-CM | POA: Diagnosis not present

## 2020-04-06 DIAGNOSIS — Z0289 Encounter for other administrative examinations: Secondary | ICD-10-CM | POA: Diagnosis not present

## 2020-04-09 DIAGNOSIS — Z79891 Long term (current) use of opiate analgesic: Secondary | ICD-10-CM | POA: Diagnosis not present

## 2020-04-17 ENCOUNTER — Telehealth: Payer: Self-pay | Admitting: Family Medicine

## 2020-04-19 NOTE — Telephone Encounter (Signed)
Robaxin Last filled:  03/20/20, #60 Last OV:  01/06/20, CPE Next OV:  01/03/21, CPE

## 2020-04-22 NOTE — Telephone Encounter (Signed)
ERx 

## 2020-04-22 NOTE — Telephone Encounter (Signed)
  LAST APPOINTMENT DATE: 03/20/2020   NEXT APPOINTMENT DATE:@Visit  date not found  MEDICATION: methocarbmol  PHARMACY: [facility] [address]   Let patient know to contact pharmacy at the end of the day to make sure medication is ready.  Please notify patient to allow 48-72 hours to process  Encourage patient to contact the pharmacy for refills or they can request refills through [facility]  CLINICAL FILLS OUT ALL BELOW:   LAST REFILL:  QTY:  REFILL DATE:    OTHER COMMENTS:    Okay for refill?  Please advise

## 2020-05-03 DIAGNOSIS — M25562 Pain in left knee: Secondary | ICD-10-CM | POA: Diagnosis not present

## 2020-05-10 DIAGNOSIS — M25562 Pain in left knee: Secondary | ICD-10-CM | POA: Diagnosis not present

## 2020-05-13 DIAGNOSIS — Z79899 Other long term (current) drug therapy: Secondary | ICD-10-CM | POA: Diagnosis not present

## 2020-05-13 DIAGNOSIS — Z79891 Long term (current) use of opiate analgesic: Secondary | ICD-10-CM | POA: Diagnosis not present

## 2020-05-13 DIAGNOSIS — Z0289 Encounter for other administrative examinations: Secondary | ICD-10-CM | POA: Diagnosis not present

## 2020-05-13 DIAGNOSIS — F1120 Opioid dependence, uncomplicated: Secondary | ICD-10-CM | POA: Diagnosis not present

## 2020-05-14 DIAGNOSIS — M25562 Pain in left knee: Secondary | ICD-10-CM | POA: Diagnosis not present

## 2020-05-27 DIAGNOSIS — S83232A Complex tear of medial meniscus, current injury, left knee, initial encounter: Secondary | ICD-10-CM | POA: Diagnosis not present

## 2020-05-27 DIAGNOSIS — X58XXXA Exposure to other specified factors, initial encounter: Secondary | ICD-10-CM | POA: Diagnosis not present

## 2020-05-27 DIAGNOSIS — Y999 Unspecified external cause status: Secondary | ICD-10-CM | POA: Diagnosis not present

## 2020-05-27 DIAGNOSIS — M2242 Chondromalacia patellae, left knee: Secondary | ICD-10-CM | POA: Diagnosis not present

## 2020-05-27 DIAGNOSIS — G8918 Other acute postprocedural pain: Secondary | ICD-10-CM | POA: Diagnosis not present

## 2020-06-16 ENCOUNTER — Other Ambulatory Visit: Payer: Self-pay | Admitting: Family Medicine

## 2020-06-17 NOTE — Telephone Encounter (Signed)
Name of Medication: Ambien Name of Pharmacy: [facility] Last Fill or Written Date and Quantity: 05/13/20, #30 Last Office Visit and Type: 12/30/19, CPE Next Office Visit and Type: 01/03/21, CPE Last Controlled Substance Agreement Date: none Last UDS: none

## 2020-06-18 NOTE — Telephone Encounter (Signed)
Pt called to report that Rx is requiring PA

## 2020-06-21 NOTE — Telephone Encounter (Signed)
ERx May require PA

## 2020-06-25 NOTE — Telephone Encounter (Signed)
Pt is calling to check on whether a PA has been completed

## 2020-06-29 NOTE — Telephone Encounter (Signed)
Submitted PA for Ambien 10 mg tab; key:  [id number].  Decision pending.

## 2020-06-30 NOTE — Telephone Encounter (Signed)
Received faxed PA approval, valid 05/30/2020- 06/29/2021.  Made pharmacy aware.

## 2020-07-12 DIAGNOSIS — Z79899 Other long term (current) drug therapy: Secondary | ICD-10-CM | POA: Diagnosis not present

## 2020-07-12 DIAGNOSIS — Z0289 Encounter for other administrative examinations: Secondary | ICD-10-CM | POA: Diagnosis not present

## 2020-07-12 DIAGNOSIS — F1120 Opioid dependence, uncomplicated: Secondary | ICD-10-CM | POA: Diagnosis not present

## 2020-07-13 DIAGNOSIS — Z79891 Long term (current) use of opiate analgesic: Secondary | ICD-10-CM | POA: Diagnosis not present

## 2020-07-28 ENCOUNTER — Other Ambulatory Visit: Payer: Self-pay | Admitting: Family Medicine

## 2020-07-28 NOTE — Telephone Encounter (Signed)
Robaxin Last filled:  07/09/20, #60 Last OV:  12/30/19, CPE Next OV:  01/03/21, CPE

## 2020-09-09 DIAGNOSIS — Z79899 Other long term (current) drug therapy: Secondary | ICD-10-CM | POA: Diagnosis not present

## 2020-09-09 DIAGNOSIS — Z0289 Encounter for other administrative examinations: Secondary | ICD-10-CM | POA: Diagnosis not present

## 2020-09-09 DIAGNOSIS — F1120 Opioid dependence, uncomplicated: Secondary | ICD-10-CM | POA: Diagnosis not present

## 2020-09-09 DIAGNOSIS — Z79891 Long term (current) use of opiate analgesic: Secondary | ICD-10-CM | POA: Diagnosis not present

## 2020-09-14 ENCOUNTER — Other Ambulatory Visit: Payer: Self-pay | Admitting: Family Medicine

## 2020-09-14 NOTE — Telephone Encounter (Signed)
ERx 

## 2020-11-22 DIAGNOSIS — F1120 Opioid dependence, uncomplicated: Secondary | ICD-10-CM | POA: Diagnosis not present

## 2020-11-22 DIAGNOSIS — Z79899 Other long term (current) drug therapy: Secondary | ICD-10-CM | POA: Diagnosis not present

## 2020-11-22 DIAGNOSIS — Z0289 Encounter for other administrative examinations: Secondary | ICD-10-CM | POA: Diagnosis not present

## 2020-11-23 DIAGNOSIS — Z79891 Long term (current) use of opiate analgesic: Secondary | ICD-10-CM | POA: Diagnosis not present

## 2020-12-07 ENCOUNTER — Other Ambulatory Visit: Payer: Self-pay | Admitting: Family Medicine

## 2020-12-08 NOTE — Telephone Encounter (Signed)
Robaxin Last filled:  11/08/20, #60 Last OV:  12/30/19, CPE Next OV:  01/03/21, CPE

## 2020-12-25 ENCOUNTER — Other Ambulatory Visit: Payer: Self-pay | Admitting: Family Medicine

## 2020-12-25 DIAGNOSIS — E785 Hyperlipidemia, unspecified: Secondary | ICD-10-CM

## 2020-12-25 DIAGNOSIS — R7303 Prediabetes: Secondary | ICD-10-CM

## 2020-12-25 DIAGNOSIS — Z125 Encounter for screening for malignant neoplasm of prostate: Secondary | ICD-10-CM

## 2020-12-25 DIAGNOSIS — I10 Essential (primary) hypertension: Secondary | ICD-10-CM

## 2020-12-27 ENCOUNTER — Other Ambulatory Visit: Payer: Federal, State, Local not specified - PPO

## 2021-01-03 ENCOUNTER — Other Ambulatory Visit: Payer: Self-pay

## 2021-01-03 ENCOUNTER — Ambulatory Visit (INDEPENDENT_AMBULATORY_CARE_PROVIDER_SITE_OTHER): Payer: Federal, State, Local not specified - PPO | Admitting: Family Medicine

## 2021-01-03 ENCOUNTER — Encounter: Payer: Self-pay | Admitting: Family Medicine

## 2021-01-03 VITALS — BP 150/88 | HR 75 | Temp 98.2°F | Ht 73.0 in | Wt 233.1 lb

## 2021-01-03 DIAGNOSIS — I10 Essential (primary) hypertension: Secondary | ICD-10-CM

## 2021-01-03 DIAGNOSIS — Z125 Encounter for screening for malignant neoplasm of prostate: Secondary | ICD-10-CM

## 2021-01-03 DIAGNOSIS — Z23 Encounter for immunization: Secondary | ICD-10-CM

## 2021-01-03 DIAGNOSIS — E66811 Obesity, class 1: Secondary | ICD-10-CM

## 2021-01-03 DIAGNOSIS — E785 Hyperlipidemia, unspecified: Secondary | ICD-10-CM | POA: Diagnosis not present

## 2021-01-03 DIAGNOSIS — Z0000 Encounter for general adult medical examination without abnormal findings: Secondary | ICD-10-CM | POA: Diagnosis not present

## 2021-01-03 DIAGNOSIS — R7303 Prediabetes: Secondary | ICD-10-CM | POA: Diagnosis not present

## 2021-01-03 DIAGNOSIS — Z8249 Family history of ischemic heart disease and other diseases of the circulatory system: Secondary | ICD-10-CM

## 2021-01-03 DIAGNOSIS — G894 Chronic pain syndrome: Secondary | ICD-10-CM

## 2021-01-03 DIAGNOSIS — E669 Obesity, unspecified: Secondary | ICD-10-CM

## 2021-01-03 DIAGNOSIS — Z1211 Encounter for screening for malignant neoplasm of colon: Secondary | ICD-10-CM

## 2021-01-03 DIAGNOSIS — F5104 Psychophysiologic insomnia: Secondary | ICD-10-CM

## 2021-01-03 LAB — COMPREHENSIVE METABOLIC PANEL
ALT: 26 U/L (ref 0–53)
AST: 22 U/L (ref 0–37)
Albumin: 4.8 g/dL (ref 3.5–5.2)
Alkaline Phosphatase: 52 U/L (ref 39–117)
BUN: 19 mg/dL (ref 6–23)
CO2: 24 mEq/L (ref 19–32)
Calcium: 9.4 mg/dL (ref 8.4–10.5)
Chloride: 105 mEq/L (ref 96–112)
Creatinine, Ser: 1.18 mg/dL (ref 0.40–1.50)
GFR: 71.14 mL/min (ref >60.00)
Glucose, Bld: 103 mg/dL — ABNORMAL HIGH (ref 70–99)
Potassium: 4.3 mEq/L (ref 3.5–5.1)
Sodium: 138 mEq/L (ref 135–145)
Total Bilirubin: 0.4 mg/dL (ref 0.2–1.2)
Total Protein: 7.1 g/dL (ref 6.0–8.3)

## 2021-01-03 LAB — PSA: PSA: 0.53 ng/mL (ref 0.10–4.00)

## 2021-01-03 LAB — LIPID PANEL
Cholesterol: 196 mg/dL (ref 0–200)
HDL: 58 mg/dL (ref >39.00)
LDL Cholesterol: 116 mg/dL — ABNORMAL HIGH (ref 0–99)
NonHDL: 138.43
Total CHOL/HDL Ratio: 3
Triglycerides: 110 mg/dL (ref 0.0–149.0)
VLDL: 22 mg/dL (ref 0.0–40.0)

## 2021-01-03 LAB — MICROALBUMIN / CREATININE URINE RATIO
Creatinine,U: 169.3 mg/dL
Microalb Creat Ratio: 0.4 mg/g (ref 0.0–30.0)
Microalb, Ur: <0.7 mg/dL (ref 0.0–1.9)

## 2021-01-03 LAB — HEMOGLOBIN A1C: Hgb A1c MFr Bld: 5.7 % (ref 4.6–6.5)

## 2021-01-03 MED ORDER — FENOFIBRATE 145 MG PO TABS: 72.5000 mg | ORAL_TABLET | Freq: Every day | ORAL | 3 refills | Status: DC

## 2021-01-03 MED ORDER — ZOLPIDEM TARTRATE 10 MG PO TABS: 5.0000 mg | ORAL_TABLET | Freq: Every evening | ORAL | 1 refills | Status: DC | PRN

## 2021-01-03 MED ORDER — ATORVASTATIN CALCIUM 40 MG PO TABS: 40.0000 mg | ORAL_TABLET | Freq: Every day | ORAL | 3 refills | Status: DC

## 2021-01-03 MED ORDER — AMLODIPINE BESYLATE 10 MG PO TABS: 10.0000 mg | ORAL_TABLET | Freq: Every day | ORAL | 3 refills | Status: DC

## 2021-01-03 NOTE — Assessment & Plan Note (Signed)
Update FLP on atorvastatin and fenofibrate. The 10-year ASCVD risk score (Arnett DK, et al., 2019) is: 5.3%   Values used to calculate the score:     Age: 50 years     Sex: Male     Is Non-Hispanic African American: No     Diabetic: No     Tobacco smoker: No     Systolic Blood Pressure: 150 mmHg     Is BP treated: Yes     HDL Cholesterol: 51.7 mg/dL     Total Cholesterol: 175 mg/dL

## 2021-01-03 NOTE — Assessment & Plan Note (Addendum)
Encouraged ongoing healthy diet and lifestyle to affect healthy weight goals.

## 2021-01-03 NOTE — Progress Notes (Signed)
Patient ID: William Friedman, male    DOB: 1970/12/13, 50 y.o.   MRN: [id number]  This visit was conducted in person.  BP (!) 150/88   Pulse 75   Temp 98.2 F (36.8 C) (Temporal)   Ht 6' 1"  (1.854 m)   Wt 233 lb 2 oz (105.7 kg)   SpO2 97%   BMI 30.76 kg/m   BP Readings from Last 3 Encounters:  01/03/21 (!) 150/88  12/30/19 132/80  11/04/18 132/84  On repeat 160/86   CC: CPE Subjective:   HPI: William Friedman is a 50 y.o. male presenting on 01/03/2021 for Annual Exam   Trazodone ineffective for insomnia - continues ambien.  Chronic pain followed by [facility] psych counseling pain clinic on suboxone Takes amlodipine 56m daily, doesn't check BP at home. No HA, vision changes, CP/tightness, SOB, leg swelling.    Preventative: Colon cancer screening - discussed, would like iFOB  Prostate cancer screening - continue yearly PSA. Asxs.  Lung cancer screening - not eligible  Flu shot - yearly CTice2/2021 x2, no booster.  Tetanus ~2012  Shingrix - discussed  Seat belt use discussed  Sunscreen use discussed. No changing moles on skin.  Non smoker  Alcohol - 1-2 mixed drinks/wk Dentist - yearly  Eye exam - yearly, s/p lasik 2006   Lives with wife and daughter, 1 dog  Occupation: sAdministrator- [employer]  Edu: BS  Activity: golfing, walking 3-4 times a week  Diet: overall better - avoiding junk food, good water, backed off soda, more fish and fruits/vegetables      Relevant past medical, surgical, family and social history reviewed and updated as indicated. Interim medical history since our last visit reviewed. Allergies and medications reviewed and updated. Outpatient Medications Prior to Visit  Medication Sig Dispense Refill   aspirin EC 81 MG tablet Take 81 mg by mouth every other day.     Buprenorphine HCl-Naloxone HCl 2-0.5 MG FILM Place 1 tablet under the tongue every other day.  0   methocarbamol (ROBAXIN) 750 MG tablet TAKE 1 TABLET BY  MOUTH THREE TIMES A DAY AS NEEDED FOR MUSCLE SPASMS 60 tablet 3   Multiple Vitamins-Minerals (MULTIVITAMIN PO) Take 1 tablet by mouth daily.     amLODipine (NORVASC) 10 MG tablet Take 1 tablet (10 mg total) by mouth daily. 90 tablet 3   atorvastatin (LIPITOR) 40 MG tablet Take 1 tablet (40 mg total) by mouth daily. 90 tablet 3   fenofibrate (TRICOR) 145 MG tablet Take 1 tablet (145 mg total) by mouth daily. 90 tablet 3   zolpidem (AMBIEN) 10 MG tablet TAKE 1/2 TO 1 TABLET BY MOUTH AT BEDTIME AS NEEDED FOR SLEEP 30 tablet 1   No facility-administered medications prior to visit.     Per HPI unless specifically indicated in ROS section below Review of Systems  Constitutional:  Negative for activity change, appetite change, chills, fatigue, fever and unexpected weight change.  HENT:  Negative for hearing loss.   Eyes:  Negative for visual disturbance.  Respiratory:  Negative for cough, chest tightness, shortness of breath and wheezing.   Cardiovascular:  Negative for chest pain, palpitations and leg swelling.  Gastrointestinal:  Negative for abdominal distention, abdominal pain, blood in stool, constipation, diarrhea, nausea and vomiting.  Genitourinary:  Negative for difficulty urinating and hematuria.  Musculoskeletal:  Negative for arthralgias, myalgias and neck pain.  Skin:  Negative for rash.  Neurological:  Negative for dizziness, seizures, syncope  and headaches.  Hematological:  Negative for adenopathy. Does not bruise/bleed easily.  Psychiatric/Behavioral:  Negative for dysphoric mood. The patient is not nervous/anxious.    Objective:  BP (!) 150/88   Pulse 75   Temp 98.2 F (36.8 C) (Temporal)   Ht 6' 1"  (1.854 m)   Wt 233 lb 2 oz (105.7 kg)   SpO2 97%   BMI 30.76 kg/m   Wt Readings from Last 3 Encounters:  01/03/21 233 lb 2 oz (105.7 kg)  12/30/19 230 lb 6 oz (104.5 kg)  11/04/18 229 lb 8 oz (104.1 kg)      Physical Exam Vitals and nursing note reviewed.   Constitutional:      General: He is not in acute distress.    Appearance: Normal appearance. He is well-developed. He is not ill-appearing.  HENT:     Head: Normocephalic and atraumatic.     Right Ear: Hearing, tympanic membrane, ear canal and external ear normal.     Left Ear: Hearing, tympanic membrane, ear canal and external ear normal.  Eyes:     General: No scleral icterus.    Extraocular Movements: Extraocular movements intact.     Conjunctiva/sclera: Conjunctivae normal.     Pupils: Pupils are equal, round, and reactive to light.  Neck:     Thyroid: No thyroid mass or thyromegaly.  Cardiovascular:     Rate and Rhythm: Normal rate and regular rhythm.     Pulses: Normal pulses.          Radial pulses are 2+ on the right side and 2+ on the left side.     Heart sounds: Normal heart sounds. No murmur heard. Pulmonary:     Effort: Pulmonary effort is normal. No respiratory distress.     Breath sounds: Normal breath sounds. No wheezing, rhonchi or rales.  Abdominal:     General: Bowel sounds are normal. There is no distension.     Palpations: Abdomen is soft. There is no mass.     Tenderness: There is no abdominal tenderness. There is no guarding or rebound.     Hernia: No hernia is present.  Musculoskeletal:        General: Normal range of motion.     Cervical back: Normal range of motion and neck supple.     Right lower leg: No edema.     Left lower leg: No edema.  Lymphadenopathy:     Cervical: No cervical adenopathy.  Skin:    General: Skin is warm and dry.     Findings: No rash.  Neurological:     General: No focal deficit present.     Mental Status: He is alert and oriented to person, place, and time.  Psychiatric:        Mood and Affect: Mood normal.        Behavior: Behavior normal.        Thought Content: Thought content normal.        Judgment: Judgment normal.      Results for orders placed or performed in visit on 01/06/20  Fecal Occult Blood, Guaiac   Result Value Ref Range   Fecal Occult Blood Negative     Assessment & Plan:  This visit occurred during the SARS-CoV-2 public health emergency.  Safety protocols were in place, including screening questions prior to the visit, additional usage of staff PPE, and extensive cleaning of exam room while observing appropriate contact time as indicated for disinfecting solutions.   Problem List Items  Addressed This Visit     Health maintenance examination - Primary (Chronic)    Preventative protocols reviewed and updated unless pt declined. Discussed healthy diet and lifestyle.       HTN (hypertension)    Chronic on amlodipine. Above goal readings noted today. Reviewed diet choices to improve blood pressure control, advised he start monitoring blood pressures at home and call us with readings in 1-2 wks to determine need for additional medication - he agrees with plan.       Relevant Medications   amLODipine (NORVASC) 10 MG tablet   fenofibrate (TRICOR) 145 MG tablet   atorvastatin (LIPITOR) 40 MG tablet   Chronic pain syndrome    Regularly sees pain clinic.       Obesity, Class I, BMI 30-34.9    Encouraged ongoing healthy diet and lifestyle to affect healthy weight goals.       Dyslipidemia    Update FLP on atorvastatin and fenofibrate. The 10-year ASCVD risk score (Arnett DK, et al., 2019) is: 5.3%   Values used to calculate the score:     Age: 14 years     Sex: Male     Is Non-Hispanic African American: No     Diabetic: No     Tobacco smoker: No     Systolic Blood Pressure: 740 mmHg     Is BP treated: Yes     HDL Cholesterol: 51.7 mg/dL     Total Cholesterol: 175 mg/dL       Relevant Medications   fenofibrate (TRICOR) 145 MG tablet   atorvastatin (LIPITOR) 40 MG tablet   Prediabetes    Update A1c.       Chronic insomnia    Continues 1/2 ambien nightly.       Family history of early CAD    Continue aspirin, statin.       Other Visit Diagnoses     Need for  influenza vaccination       Relevant Orders   Flu Vaccine QUAD 39moIM (Fluarix, Fluzone & Alfiuria Quad PF) (Completed)   Special screening for malignant neoplasm of prostate       Special screening for malignant neoplasms, colon       Relevant Orders   Fecal occult blood, imunochemical        Meds ordered this encounter  Medications   amLODipine (NORVASC) 10 MG tablet    Sig: Take 1 tablet (10 mg total) by mouth daily.    Dispense:  90 tablet    Refill:  3   fenofibrate (TRICOR) 145 MG tablet    Sig: Take 0.5 tablets (72.5 mg total) by mouth daily.    Dispense:  45 tablet    Refill:  3   atorvastatin (LIPITOR) 40 MG tablet    Sig: Take 1 tablet (40 mg total) by mouth daily.    Dispense:  90 tablet    Refill:  3   zolpidem (AMBIEN) 10 MG tablet    Sig: Take 0.5-1 tablets (5-10 mg total) by mouth at bedtime as needed. for sleep    Dispense:  30 tablet    Refill:  1    Not to exceed 2 additional fills before 12/18/2020   Orders Placed This Encounter  Procedures   Fecal occult blood, imunochemical    Standing Status:   Future    Standing Expiration Date:   01/03/2022   Flu Vaccine QUAD 686moM (Fluarix, Fluzone & Alfiuria Quad PF)    Patient instructions: Start checking  BP at home a few times a week, call me with readings in 2 weeks. If staying consistently >140/90 then we should start second blood pressure medicine. In the meantime, work on low salt/sodium in diet, increase water, increase fruits/vegetables and whole grains.  Flu shot today  Consider shingrix vaccine.  Labs today.  Pass by lab to pick up stool kit.  You are doing well today continue current medicines Return as needed or in 1 year for next physical.   Follow up plan: Return in about 1 year (around 01/03/2022) for annual exam, prior fasting for blood work.  Ria Bush, MD

## 2021-01-03 NOTE — Assessment & Plan Note (Signed)
Chronic on amlodipine. Above goal readings noted today. Reviewed diet choices to improve blood pressure control, advised he start monitoring blood pressures at home and call us with readings in 1-2 wks to determine need for additional medication - he agrees with plan.

## 2021-01-03 NOTE — Patient Instructions (Addendum)
Start checking BP at home a few times a week, call me with readings in 2 weeks. If staying consistently >140/90 then we should start second blood pressure medicine. In the meantime, work on low salt/sodium in diet, increase water, increase fruits/vegetables and whole grains.  Flu shot today  Consider shingrix vaccine.  Labs today.  Pass by lab to pick up stool kit.  You are doing well today continue current medicines Return as needed or in 1 year for next physical.   Health Maintenance, Male Adopting a healthy lifestyle and getting preventive care are important in promoting health and wellness. Ask your health care provider about: The right schedule for you to have regular tests and exams. Things you can do on your own to prevent diseases and keep yourself healthy. What should I know about diet, weight, and exercise? Eat a healthy diet  Eat a diet that includes plenty of vegetables, fruits, low-fat dairy products, and lean protein. Do not eat a lot of foods that are high in solid fats, added sugars, or sodium. Maintain a healthy weight Body mass index (BMI) is a measurement that can be used to identify possible weight problems. It estimates body fat based on height and weight. Your health care provider can help determine your BMI and help you achieve or maintain a healthy weight. Get regular exercise Get regular exercise. This is one of the most important things you can do for your health. Most adults should: Exercise for at least 150 minutes each week. The exercise should increase your heart rate and make you sweat (moderate-intensity exercise). Do strengthening exercises at least twice a week. This is in addition to the moderate-intensity exercise. Spend less time sitting. Even light physical activity can be beneficial. Watch cholesterol and blood lipids Have your blood tested for lipids and cholesterol at 52 years of age, then have this test every 5 years. You may need to have your  cholesterol levels checked more often if: Your lipid or cholesterol levels are high. You are older than 52 years of age. You are at high risk for heart disease. What should I know about cancer screening? Many types of cancers can be detected early and may often be prevented. Depending on your health history and family history, you may need to have cancer screening at various ages. This may include screening for: Colorectal cancer. Prostate cancer. Skin cancer. Lung cancer. What should I know about heart disease, diabetes, and high blood pressure? Blood pressure and heart disease High blood pressure causes heart disease and increases the risk of stroke. This is more likely to develop in people who have high blood pressure readings or are overweight. Talk with your health care provider about your target blood pressure readings. Have your blood pressure checked: Every 3-5 years if you are 60-37 years of age. Every year if you are 40 years old or older. If you are between the ages of 19 and 63 and are a current or former smoker, ask your health care provider if you should have a one-time screening for abdominal aortic aneurysm (AAA). Diabetes Have regular diabetes screenings. This checks your fasting blood sugar level. Have the screening done: Once every three years after age 5 if you are at a normal weight and have a low risk for diabetes. More often and at a younger age if you are overweight or have a high risk for diabetes. What should I know about preventing infection? Hepatitis B If you have a higher risk for hepatitis  B, you should be screened for this virus. Talk with your health care provider to find out if you are at risk for hepatitis B infection. Hepatitis C Blood testing is recommended for: Everyone born from [birth year] through 1967. Anyone with known risk factors for hepatitis C. Sexually transmitted infections (STIs) You should be screened each year for STIs, including gonorrhea  and chlamydia, if: You are sexually active and are younger than 52 years of age. You are older than 52 years of age and your health care provider tells you that you are at risk for this type of infection. Your sexual activity has changed since you were last screened, and you are at increased risk for chlamydia or gonorrhea. Ask your health care provider if you are at risk. Ask your health care provider about whether you are at high risk for HIV. Your health care provider may recommend a prescription medicine to help prevent HIV infection. If you choose to take medicine to prevent HIV, you should first get tested for HIV. You should then be tested every 3 months for as long as you are taking the medicine. Follow these instructions at home: Alcohol use Do not drink alcohol if your health care provider tells you not to drink. If you drink alcohol: Limit how much you have to 0-2 drinks a day. Know how much alcohol is in your drink. In the [place] one drink equals one 12 oz bottle of beer (355 mL), one 5 oz glass of wine (148 mL), or one 1 oz glass of hard liquor (44 mL). Lifestyle Do not use any products that contain nicotine or tobacco. These products include cigarettes, chewing tobacco, and vaping devices, such as e-cigarettes. If you need help quitting, ask your health care provider. Do not use street drugs. Do not share needles. Ask your health care provider for help if you need support or information about quitting drugs. General instructions Schedule regular health, dental, and eye exams. Stay current with your vaccines. Tell your health care provider if: You often feel depressed. You have ever been abused or do not feel safe at home. Summary Adopting a healthy lifestyle and getting preventive care are important in promoting health and wellness. Follow your health care provider's instructions about healthy diet, exercising, and getting tested or screened for diseases. Follow your health  care provider's instructions on monitoring your cholesterol and blood pressure. This information is not intended to replace advice given to you by your health care provider. Make sure you discuss any questions you have with your health care provider. Document Revised: 06/14/2020 Document Reviewed: 06/14/2020 Elsevier Patient Education  [place].

## 2021-01-03 NOTE — Assessment & Plan Note (Signed)
Regularly sees pain clinic.  

## 2021-01-03 NOTE — Assessment & Plan Note (Signed)
Continue aspirin, statin.  

## 2021-01-03 NOTE — Assessment & Plan Note (Signed)
Continues 1/2 ambien nightly.

## 2021-01-03 NOTE — Assessment & Plan Note (Signed)
Update A1c ?

## 2021-01-03 NOTE — Assessment & Plan Note (Signed)
Preventative protocols reviewed and updated unless pt declined. Discussed healthy diet and lifestyle.  

## 2021-01-04 ENCOUNTER — Other Ambulatory Visit (INDEPENDENT_AMBULATORY_CARE_PROVIDER_SITE_OTHER): Payer: Federal, State, Local not specified - PPO

## 2021-01-04 DIAGNOSIS — Z1211 Encounter for screening for malignant neoplasm of colon: Secondary | ICD-10-CM

## 2021-01-04 LAB — FECAL OCCULT BLOOD, IMMUNOCHEMICAL: Fecal Occult Bld: NEGATIVE

## 2021-01-04 LAB — FECAL OCCULT BLOOD, GUAIAC: Fecal Occult Blood: NEGATIVE

## 2021-01-05 ENCOUNTER — Encounter: Payer: Self-pay | Admitting: Family Medicine

## 2021-01-25 DIAGNOSIS — Z79891 Long term (current) use of opiate analgesic: Secondary | ICD-10-CM | POA: Diagnosis not present

## 2021-01-25 DIAGNOSIS — F1120 Opioid dependence, uncomplicated: Secondary | ICD-10-CM | POA: Diagnosis not present

## 2021-01-25 DIAGNOSIS — Z0289 Encounter for other administrative examinations: Secondary | ICD-10-CM | POA: Diagnosis not present

## 2021-01-25 DIAGNOSIS — Z79899 Other long term (current) drug therapy: Secondary | ICD-10-CM | POA: Diagnosis not present

## 2021-03-24 DIAGNOSIS — Z79899 Other long term (current) drug therapy: Secondary | ICD-10-CM | POA: Diagnosis not present

## 2021-03-24 DIAGNOSIS — Z79891 Long term (current) use of opiate analgesic: Secondary | ICD-10-CM | POA: Diagnosis not present

## 2021-03-24 DIAGNOSIS — Z0289 Encounter for other administrative examinations: Secondary | ICD-10-CM | POA: Diagnosis not present

## 2021-03-24 DIAGNOSIS — F1120 Opioid dependence, uncomplicated: Secondary | ICD-10-CM | POA: Diagnosis not present

## 2021-04-01 ENCOUNTER — Other Ambulatory Visit: Payer: Self-pay | Admitting: Family Medicine

## 2021-04-01 NOTE — Telephone Encounter (Signed)
Refill request Robaxin Last office visit 01/03/21 Last refill 12/09/20 #60/3

## 2021-04-04 NOTE — Telephone Encounter (Signed)
Last filled 123-27-22 #30 Last OV 01-03-21 Next OV 01-03-22 [facility]

## 2021-04-05 NOTE — Telephone Encounter (Signed)
ERx 

## 2021-05-23 DIAGNOSIS — Z79899 Other long term (current) drug therapy: Secondary | ICD-10-CM | POA: Diagnosis not present

## 2021-05-23 DIAGNOSIS — F1120 Opioid dependence, uncomplicated: Secondary | ICD-10-CM | POA: Diagnosis not present

## 2021-05-23 DIAGNOSIS — Z0289 Encounter for other administrative examinations: Secondary | ICD-10-CM | POA: Diagnosis not present

## 2021-07-16 ENCOUNTER — Encounter: Payer: Self-pay | Admitting: Family Medicine

## 2021-07-18 MED ORDER — BENAZEPRIL HCL 10 MG PO TABS: 10.0000 mg | ORAL_TABLET | Freq: Every day | ORAL | 6 refills | Status: DC

## 2021-07-19 DIAGNOSIS — F1120 Opioid dependence, uncomplicated: Secondary | ICD-10-CM | POA: Diagnosis not present

## 2021-07-19 DIAGNOSIS — Z79899 Other long term (current) drug therapy: Secondary | ICD-10-CM | POA: Diagnosis not present

## 2021-07-19 DIAGNOSIS — Z0289 Encounter for other administrative examinations: Secondary | ICD-10-CM | POA: Diagnosis not present

## 2021-08-07 ENCOUNTER — Other Ambulatory Visit: Payer: Self-pay | Admitting: Family Medicine

## 2021-08-08 NOTE — Telephone Encounter (Signed)
Last filled 07-12-21 #60 Last OV 01-03-21 Next OV 01-03-22 [id number] [place] [place] - [address]

## 2021-08-10 NOTE — Telephone Encounter (Signed)
ERx 

## 2021-09-13 DIAGNOSIS — Z0289 Encounter for other administrative examinations: Secondary | ICD-10-CM | POA: Diagnosis not present

## 2021-09-13 DIAGNOSIS — Z79899 Other long term (current) drug therapy: Secondary | ICD-10-CM | POA: Diagnosis not present

## 2021-09-13 DIAGNOSIS — F1120 Opioid dependence, uncomplicated: Secondary | ICD-10-CM | POA: Diagnosis not present

## 2021-10-11 DIAGNOSIS — Z79899 Other long term (current) drug therapy: Secondary | ICD-10-CM | POA: Diagnosis not present

## 2021-10-11 DIAGNOSIS — F1120 Opioid dependence, uncomplicated: Secondary | ICD-10-CM | POA: Diagnosis not present

## 2021-10-11 DIAGNOSIS — Z0289 Encounter for other administrative examinations: Secondary | ICD-10-CM | POA: Diagnosis not present

## 2021-10-17 DIAGNOSIS — I10 Essential (primary) hypertension: Secondary | ICD-10-CM | POA: Diagnosis not present

## 2021-10-17 DIAGNOSIS — H5213 Myopia, bilateral: Secondary | ICD-10-CM | POA: Diagnosis not present

## 2021-11-07 ENCOUNTER — Other Ambulatory Visit: Payer: Self-pay | Admitting: Family Medicine

## 2021-11-07 NOTE — Telephone Encounter (Addendum)
Name of Medication: Ambien Name of Pharmacy: [facility] Last Fill or Written Date and Quantity: 09/11/21, #30 Last Office Visit and Type: 01/03/21, CPE Next Office Visit and Type: 01/08/23, CPE Last Controlled Substance Agreement Date: none Last UDS: none

## 2021-11-09 NOTE — Telephone Encounter (Signed)
ERx 

## 2021-12-02 ENCOUNTER — Other Ambulatory Visit: Payer: Self-pay | Admitting: Family Medicine

## 2021-12-02 NOTE — Telephone Encounter (Signed)
Robaxin Last filled:  11/07/21, #60 Last OV:  01/03/21, CPE Next OV:  01/03/22 CPE

## 2021-12-13 DIAGNOSIS — F1120 Opioid dependence, uncomplicated: Secondary | ICD-10-CM | POA: Diagnosis not present

## 2021-12-13 DIAGNOSIS — Z0289 Encounter for other administrative examinations: Secondary | ICD-10-CM | POA: Diagnosis not present

## 2021-12-13 DIAGNOSIS — Z79899 Other long term (current) drug therapy: Secondary | ICD-10-CM | POA: Diagnosis not present

## 2021-12-25 ENCOUNTER — Other Ambulatory Visit: Payer: Self-pay | Admitting: Family Medicine

## 2021-12-25 DIAGNOSIS — Z8249 Family history of ischemic heart disease and other diseases of the circulatory system: Secondary | ICD-10-CM

## 2021-12-25 DIAGNOSIS — Z125 Encounter for screening for malignant neoplasm of prostate: Secondary | ICD-10-CM

## 2021-12-25 DIAGNOSIS — E785 Hyperlipidemia, unspecified: Secondary | ICD-10-CM

## 2021-12-25 DIAGNOSIS — R7303 Prediabetes: Secondary | ICD-10-CM

## 2021-12-27 ENCOUNTER — Other Ambulatory Visit (INDEPENDENT_AMBULATORY_CARE_PROVIDER_SITE_OTHER): Payer: Federal, State, Local not specified - PPO

## 2021-12-27 DIAGNOSIS — R7303 Prediabetes: Secondary | ICD-10-CM

## 2021-12-27 DIAGNOSIS — E785 Hyperlipidemia, unspecified: Secondary | ICD-10-CM

## 2021-12-27 DIAGNOSIS — Z125 Encounter for screening for malignant neoplasm of prostate: Secondary | ICD-10-CM | POA: Diagnosis not present

## 2021-12-27 DIAGNOSIS — Z8249 Family history of ischemic heart disease and other diseases of the circulatory system: Secondary | ICD-10-CM

## 2021-12-27 LAB — COMPREHENSIVE METABOLIC PANEL
ALT: 23 U/L (ref 0–53)
AST: 22 U/L (ref 0–37)
Albumin: 4.7 g/dL (ref 3.5–5.2)
Alkaline Phosphatase: 48 U/L (ref 39–117)
BUN: 20 mg/dL (ref 6–23)
CO2: 26 mEq/L (ref 19–32)
Calcium: 9.2 mg/dL (ref 8.4–10.5)
Chloride: 105 mEq/L (ref 96–112)
Creatinine, Ser: 1.22 mg/dL (ref 0.40–1.50)
GFR: 67.88 mL/min (ref >60.00)
Glucose, Bld: 95 mg/dL (ref 70–99)
Potassium: 4.2 mEq/L (ref 3.5–5.1)
Sodium: 139 mEq/L (ref 135–145)
Total Bilirubin: 0.5 mg/dL (ref 0.2–1.2)
Total Protein: 7.1 g/dL (ref 6.0–8.3)

## 2021-12-27 LAB — PSA: PSA: 0.42 ng/mL (ref 0.10–4.00)

## 2021-12-27 LAB — LIPID PANEL
Cholesterol: 171 mg/dL (ref 0–200)
HDL: 50.9 mg/dL (ref >39.00)
LDL Cholesterol: 84 mg/dL (ref 0–99)
NonHDL: 120.31
Total CHOL/HDL Ratio: 3
Triglycerides: 181 mg/dL — ABNORMAL HIGH (ref 0.0–149.0)
VLDL: 36.2 mg/dL (ref 0.0–40.0)

## 2021-12-27 LAB — HEMOGLOBIN A1C: Hgb A1c MFr Bld: 5.7 % (ref 4.6–6.5)

## 2021-12-29 LAB — APOLIPOPROTEIN B: Apolipoprotein B: 88 mg/dL (ref <90)

## 2022-01-01 LAB — LIPOPROTEIN A (LPA): Lipoprotein (a): <10 nmol/L (ref <75)

## 2022-01-03 ENCOUNTER — Telehealth: Payer: Self-pay | Admitting: Family Medicine

## 2022-01-03 ENCOUNTER — Encounter: Payer: Self-pay | Admitting: Family Medicine

## 2022-01-03 ENCOUNTER — Ambulatory Visit (INDEPENDENT_AMBULATORY_CARE_PROVIDER_SITE_OTHER): Payer: Federal, State, Local not specified - PPO | Admitting: Family Medicine

## 2022-01-03 ENCOUNTER — Other Ambulatory Visit (HOSPITAL_COMMUNITY): Payer: Self-pay

## 2022-01-03 VITALS — BP 124/80 | HR 65 | Ht 73.0 in | Wt 232.0 lb

## 2022-01-03 DIAGNOSIS — G894 Chronic pain syndrome: Secondary | ICD-10-CM

## 2022-01-03 DIAGNOSIS — I10 Essential (primary) hypertension: Secondary | ICD-10-CM

## 2022-01-03 DIAGNOSIS — F5104 Psychophysiologic insomnia: Secondary | ICD-10-CM | POA: Diagnosis not present

## 2022-01-03 DIAGNOSIS — Z0000 Encounter for general adult medical examination without abnormal findings: Secondary | ICD-10-CM

## 2022-01-03 DIAGNOSIS — R7303 Prediabetes: Secondary | ICD-10-CM

## 2022-01-03 DIAGNOSIS — Z23 Encounter for immunization: Secondary | ICD-10-CM

## 2022-01-03 DIAGNOSIS — E66811 Obesity, class 1: Secondary | ICD-10-CM

## 2022-01-03 DIAGNOSIS — Z1211 Encounter for screening for malignant neoplasm of colon: Secondary | ICD-10-CM | POA: Diagnosis not present

## 2022-01-03 DIAGNOSIS — E669 Obesity, unspecified: Secondary | ICD-10-CM

## 2022-01-03 DIAGNOSIS — E785 Hyperlipidemia, unspecified: Secondary | ICD-10-CM

## 2022-01-03 DIAGNOSIS — Z8249 Family history of ischemic heart disease and other diseases of the circulatory system: Secondary | ICD-10-CM

## 2022-01-03 NOTE — Assessment & Plan Note (Signed)
Encourage healthy diet and lifestyle choices to affect sustainable weight loss.  

## 2022-01-03 NOTE — Assessment & Plan Note (Signed)
Chronic, stable on current regimen - continue. 

## 2022-01-03 NOTE — Telephone Encounter (Signed)
Patient Advocate Encounter   Received notification from [employer] that prior authorization is required for Zolpidem Tartrate 10MG  tablets. PA submitted and APPROVED on 01/03/2022.  Key  01/05/2022 Effective: 12/04/2021 - 01/03/2023

## 2022-01-03 NOTE — Assessment & Plan Note (Addendum)
Continues ambien 1/2 tab nightly.  Melatonin, trazodone previously ineffective.  Will ask to fill out PA.

## 2022-01-03 NOTE — Progress Notes (Signed)
Patient ID: William Friedman, male    DOB: 11-03-1970, 51 y.o.   MRN: [id number]  This visit was conducted in person.  BP 124/80 (BP Location: Left Arm, Patient Position: Sitting, Cuff Size: Normal)   Pulse 65   Ht 6\' 1"  (1.854 m)   Wt 232 lb (105.2 kg)   SpO2 95%   BMI 30.61 kg/m   BP Readings from Last 3 Encounters:  01/03/22 124/80  01/03/21 (!) 150/88  12/30/19 132/80   CC: CPE Subjective:   HPI: William Friedman is a 51 y.o. male presenting on 01/03/2022 for Annual Exam   Trazodone ineffective for insomnia - continues ambien for sleep.   Chronic pain followed by [facility] psych counseling pain clinic on suboxone  HTN - takes amlodipine 10mg  daily and benazepril 10mg  daily, BP running 140s/80s at home. No HA, vision changes, CP/tightness, SOB, leg swelling.    Preventative: Colon cancer screening - yearly iFOB normal. Discussed colonoscopy - will refer to [place].  Prostate cancer screening - continue yearly PSA. Asxs.  Lung cancer screening - not eligible  Flu shot - yearly COVID vaccine Pfizer 03/2019 x2, no booster.  Tetanus ~2012  Shingrix - discussed  Seat belt use discussed  Sunscreen use discussed. No changing moles on skin.  Non smoker  Alcohol - 3-4 mixed drinks/wk  Dentist - yearly  Eye exam - yearly, s/p lasik 2006   Lives with wife and daughter, 1 dog  Occupation: 04/2019 - [employer]  Edu: BS  Activity: golfing, walking 9 holes 3-4 times a week  Diet: avoids junk food, good water, backed off soda, more fish and fruits/vegetables      Relevant past medical, surgical, family and social history reviewed and updated as indicated. Interim medical history since our last visit reviewed. Allergies and medications reviewed and updated. Outpatient Medications Prior to Visit  Medication Sig Dispense Refill   amLODipine (NORVASC) 10 MG tablet Take 1 tablet (10 mg total) by mouth daily. 90 tablet 3   atorvastatin (LIPITOR) 40 MG tablet Take 1 tablet  (40 mg total) by mouth daily. 90 tablet 3   benazepril (LOTENSIN) 10 MG tablet Take 1 tablet (10 mg total) by mouth daily. 30 tablet 6   Buprenorphine HCl-Naloxone HCl 2-0.5 MG FILM Place 1 tablet under the tongue every other day.  0   fenofibrate (TRICOR) 145 MG tablet Take 0.5 tablets (72.5 mg total) by mouth daily. 45 tablet 3   methocarbamol (ROBAXIN) 750 MG tablet TAKE 1 TABLET BY MOUTH THREE TIMES A DAY AS NEEDED FOR MUSCLE SPASM 60 tablet 3   Multiple Vitamins-Minerals (MULTIVITAMIN PO) Take 1 tablet by mouth daily.     zolpidem (AMBIEN) 10 MG tablet TAKE 1/2 TO 1 TABLET BY MOUTH AT BEDTIME AS NEEDED FOR SLEEP 30 tablet 1   aspirin EC 81 MG tablet Take 81 mg by mouth every other day.     No facility-administered medications prior to visit.     Per HPI unless specifically indicated in ROS section below Review of Systems  Constitutional:  Negative for activity change, appetite change, chills, fatigue, fever and unexpected weight change.  HENT:  Negative for hearing loss.   Eyes:  Negative for visual disturbance.  Respiratory:  Negative for cough, chest tightness, shortness of breath and wheezing.   Cardiovascular:  Negative for chest pain, palpitations and leg swelling.  Gastrointestinal:  Negative for abdominal distention, abdominal pain, blood in stool, constipation, diarrhea, nausea and vomiting.  Genitourinary:  Negative for difficulty urinating and hematuria.  Musculoskeletal:  Negative for arthralgias, myalgias and neck pain.  Skin:  Negative for rash.  Neurological:  Negative for dizziness, seizures, syncope and headaches.  Hematological:  Negative for adenopathy. Does not bruise/bleed easily.  Psychiatric/Behavioral:  Negative for dysphoric mood. The patient is not nervous/anxious.     Objective:  BP 124/80 (BP Location: Left Arm, Patient Position: Sitting, Cuff Size: Normal)   Pulse 65   Ht 6\' 1"  (1.854 m)   Wt 232 lb (105.2 kg)   SpO2 95%   BMI 30.61 kg/m   Wt  Readings from Last 3 Encounters:  01/03/22 232 lb (105.2 kg)  01/03/21 233 lb 2 oz (105.7 kg)  12/30/19 230 lb 6 oz (104.5 kg)      Physical Exam Vitals and nursing note reviewed.  Constitutional:      General: He is not in acute distress.    Appearance: Normal appearance. He is well-developed. He is obese. He is not ill-appearing.  HENT:     Head: Normocephalic and atraumatic.     Right Ear: Hearing, tympanic membrane, ear canal and external ear normal.     Left Ear: Hearing, tympanic membrane, ear canal and external ear normal.  Eyes:     General: No scleral icterus.    Extraocular Movements: Extraocular movements intact.     Conjunctiva/sclera: Conjunctivae normal.     Pupils: Pupils are equal, round, and reactive to light.  Neck:     Thyroid: No thyroid mass or thyromegaly.  Cardiovascular:     Rate and Rhythm: Normal rate and regular rhythm.     Pulses: Normal pulses.          Radial pulses are 2+ on the right side and 2+ on the left side.     Heart sounds: Normal heart sounds. No murmur heard. Pulmonary:     Effort: Pulmonary effort is normal. No respiratory distress.     Breath sounds: Normal breath sounds. No wheezing, rhonchi or rales.  Abdominal:     General: Bowel sounds are normal. There is no distension.     Palpations: Abdomen is soft. There is no mass.     Tenderness: There is no abdominal tenderness. There is no guarding or rebound.     Hernia: No hernia is present.  Musculoskeletal:        General: Normal range of motion.     Cervical back: Normal range of motion and neck supple.     Right lower leg: No edema.     Left lower leg: No edema.  Lymphadenopathy:     Cervical: No cervical adenopathy.  Skin:    General: Skin is warm and dry.     Findings: No rash.  Neurological:     General: No focal deficit present.     Mental Status: He is alert and oriented to person, place, and time.  Psychiatric:        Mood and Affect: Mood normal.        Behavior:  Behavior normal.        Thought Content: Thought content normal.        Judgment: Judgment normal.       Results for orders placed or performed in visit on 12/27/21  Apolipoprotein B  Result Value Ref Range   Apolipoprotein B 88 <90 mg/dL  Lipoprotein A (LPA)  Result Value Ref Range   Lipoprotein (a) <10 <75 nmol/L  PSA  Result Value Ref Range  PSA 0.42 0.10 - 4.00 ng/mL  Hemoglobin A1c  Result Value Ref Range   Hgb A1c MFr Bld 5.7 4.6 - 6.5 %  Comprehensive metabolic panel  Result Value Ref Range   Sodium 139 135 - 145 mEq/L   Potassium 4.2 3.5 - 5.1 mEq/L   Chloride 105 96 - 112 mEq/L   CO2 26 19 - 32 mEq/L   Glucose, Bld 95 70 - 99 mg/dL   BUN 20 6 - 23 mg/dL   Creatinine, Ser 1.611.22 0.40 - 1.50 mg/dL   Total Bilirubin 0.5 0.2 - 1.2 mg/dL   Alkaline Phosphatase 48 39 - 117 U/L   AST 22 0 - 37 U/L   ALT 23 0 - 53 U/L   Total Protein 7.1 6.0 - 8.3 g/dL   Albumin 4.7 3.5 - 5.2 g/dL   GFR 09.6067.88 >45.40>60.00 mL/min   Calcium 9.2 8.4 - 10.5 mg/dL  Lipid panel  Result Value Ref Range   Cholesterol 171 0 - 200 mg/dL   Triglycerides 981.1181.0 (H) 0.0 - 149.0 mg/dL   HDL 91.4750.90 >82.95>39.00 mg/dL   VLDL 62.136.2 0.0 - 30.840.0 mg/dL   LDL Cholesterol 84 0 - 99 mg/dL   Total CHOL/HDL Ratio 3    NonHDL 120.31     Assessment & Plan:   Problem List Items Addressed This Visit       Unprioritized   Health maintenance examination - Primary (Chronic)    Preventative protocols reviewed and updated unless pt declined. Discussed healthy diet and lifestyle.  Discussed colon cancer screening options - he opts for colonoscopy referral.       HTN (hypertension)    Chronic, stable on current regimen - continue.       Chronic pain syndrome    Regularly sees pain clinic.       Obesity, Class I, BMI 30-34.9    Encourage healthy diet and lifestyle choices to affect sustainable weight loss.       Dyslipidemia    Chronic, stable period on atorvastatin and fenofibrate. Given low LP(a) levels,  reasonable to stay off aspirin.  The 10-year ASCVD risk score (Arnett DK, et al., 2019) is: 4.2%   Values used to calculate the score:     Age: [age] years     Sex: Male     Is Non-Hispanic African American: No     Diabetic: No     Tobacco smoker: No     Systolic Blood Pressure: 124 mmHg     Is BP treated: Yes     HDL Cholesterol: 50.9 mg/dL     Total Cholesterol: 171 mg/dL       Prediabetes    Stable period - watch added sugars in diet.       Chronic insomnia    Continues ambien 1/2 tab nightly.  Melatonin, trazodone previously ineffective.  Will ask to fill out PA.       Family history of early CAD   Other Visit Diagnoses     Need for influenza vaccination       Relevant Orders   Flu Vaccine QUAD 6+ mos PF IM (Fluarix Quad PF) (Completed)   Special screening for malignant neoplasms, colon       Relevant Orders   Ambulatory referral to Gastroenterology   Need for shingles vaccine       Relevant Orders   Zoster Recombinant (Shingrix ) (Completed)        No orders of the defined types were placed in this  encounter.  Orders Placed This Encounter  Procedures   Zoster Recombinant (Shingrix )   Flu Vaccine QUAD 6+ mos PF IM (Fluarix Quad PF)   Ambulatory referral to Gastroenterology    Referral Priority:   Routine    Referral Type:   Consultation    Referral Reason:   Specialty Services Required    Number of Visits Requested:   1     Patient instructions: Flu shot today  First shingrix shot today. Schedule nurse visit in 2-6 months to complete shingles series.  We will refer you for colonoscopy. You may call [facility] to schedule an appointment at [phone].   Go ahead and stop baby aspirin.  Continue other medicines.  Return in 1 year for next physical.   Follow up plan: Return in about 1 year (around 01/04/2023) for annual exam, prior fasting for blood work.  Eustaquio Boyden, MD

## 2022-01-03 NOTE — Assessment & Plan Note (Signed)
Stable period - watch added sugars in diet.

## 2022-01-03 NOTE — Assessment & Plan Note (Signed)
Chronic, stable period on atorvastatin and fenofibrate. Given low LP(a) levels, reasonable to stay off aspirin.  The 10-year ASCVD risk score (Arnett DK, et al., 2019) is: 4.2%   Values used to calculate the score:     Age: 51 years     Sex: Male     Is Non-Hispanic African American: No     Diabetic: No     Tobacco smoker: No     Systolic Blood Pressure: 124 mmHg     Is BP treated: Yes     HDL Cholesterol: 50.9 mg/dL     Total Cholesterol: 171 mg/dL

## 2022-01-03 NOTE — Telephone Encounter (Signed)
Pt requests PA for ambien to be completed. Will forward to Joellen. Thank you

## 2022-01-03 NOTE — Assessment & Plan Note (Signed)
Regularly sees pain clinic.

## 2022-01-03 NOTE — Assessment & Plan Note (Addendum)
Preventative protocols reviewed and updated unless pt declined. Discussed healthy diet and lifestyle.  Discussed colon cancer screening options - he opts for colonoscopy referral.

## 2022-01-03 NOTE — Patient Instructions (Addendum)
Flu shot today  First shingrix shot today. Schedule nurse visit in 2-6 months to complete shingles series.  We will refer you for colonoscopy. You may call [facility] to schedule an appointment at [phone].   Go ahead and stop baby aspirin.  Continue other medicines.  Return in 1 year for next physical.   Health Maintenance, Male Adopting a healthy lifestyle and getting preventive care are important in promoting health and wellness. Ask your health care provider about: The right schedule for you to have regular tests and exams. Things you can do on your own to prevent diseases and keep yourself healthy. What should I know about diet, weight, and exercise? Eat a healthy diet  Eat a diet that includes plenty of vegetables, fruits, low-fat dairy products, and lean protein. Do not eat a lot of foods that are high in solid fats, added sugars, or sodium. Maintain a healthy weight Body mass index (BMI) is a measurement that can be used to identify possible weight problems. It estimates body fat based on height and weight. Your health care provider can help determine your BMI and help you achieve or maintain a healthy weight. Get regular exercise Get regular exercise. This is one of the most important things you can do for your health. Most adults should: Exercise for at least 150 minutes each week. The exercise should increase your heart rate and make you sweat (moderate-intensity exercise). Do strengthening exercises at least twice a week. This is in addition to the moderate-intensity exercise. Spend less time sitting. Even light physical activity can be beneficial. Watch cholesterol and blood lipids Have your blood tested for lipids and cholesterol at 53 years of age, then have this test every 5 years. You may need to have your cholesterol levels checked more often if: Your lipid or cholesterol levels are high. You are older than 53 years of age. You are at high risk for heart  disease. What should I know about cancer screening? Many types of cancers can be detected early and may often be prevented. Depending on your health history and family history, you may need to have cancer screening at various ages. This may include screening for: Colorectal cancer. Prostate cancer. Skin cancer. Lung cancer. What should I know about heart disease, diabetes, and high blood pressure? Blood pressure and heart disease High blood pressure causes heart disease and increases the risk of stroke. This is more likely to develop in people who have high blood pressure readings or are overweight. Talk with your health care provider about your target blood pressure readings. Have your blood pressure checked: Every 3-5 years if you are 40-60 years of age. Every year if you are 10 years old or older. If you are between the ages of 35 and 33 and are a current or former smoker, ask your health care provider if you should have a one-time screening for abdominal aortic aneurysm (AAA). Diabetes Have regular diabetes screenings. This checks your fasting blood sugar level. Have the screening done: Once every three years after age 68 if you are at a normal weight and have a low risk for diabetes. More often and at a younger age if you are overweight or have a high risk for diabetes. What should I know about preventing infection? Hepatitis B If you have a higher risk for hepatitis B, you should be screened for this virus. Talk with your health care provider to find out if you are at risk for hepatitis B infection. Hepatitis  C Blood testing is recommended for: Everyone born from [birth year] through 1967. Anyone with known risk factors for hepatitis C. Sexually transmitted infections (STIs) You should be screened each year for STIs, including gonorrhea and chlamydia, if: You are sexually active and are younger than 53 years of age. You are older than 53 years of age and your health care provider tells you  that you are at risk for this type of infection. Your sexual activity has changed since you were last screened, and you are at increased risk for chlamydia or gonorrhea. Ask your health care provider if you are at risk. Ask your health care provider about whether you are at high risk for HIV. Your health care provider may recommend a prescription medicine to help prevent HIV infection. If you choose to take medicine to prevent HIV, you should first get tested for HIV. You should then be tested every 3 months for as long as you are taking the medicine. Follow these instructions at home: Alcohol use Do not drink alcohol if your health care provider tells you not to drink. If you drink alcohol: Limit how much you have to 0-2 drinks a day. Know how much alcohol is in your drink. In the [place] one drink equals one 12 oz bottle of beer (355 mL), one 5 oz glass of wine (148 mL), or one 1 oz glass of hard liquor (44 mL). Lifestyle Do not use any products that contain nicotine or tobacco. These products include cigarettes, chewing tobacco, and vaping devices, such as e-cigarettes. If you need help quitting, ask your health care provider. Do not use street drugs. Do not share needles. Ask your health care provider for help if you need support or information about quitting drugs. General instructions Schedule regular health, dental, and eye exams. Stay current with your vaccines. Tell your health care provider if: You often feel depressed. You have ever been abused or do not feel safe at home. Summary Adopting a healthy lifestyle and getting preventive care are important in promoting health and wellness. Follow your health care provider's instructions about healthy diet, exercising, and getting tested or screened for diseases. Follow your health care provider's instructions on monitoring your cholesterol and blood pressure. This information is not intended to replace advice given to you by your health  care provider. Make sure you discuss any questions you have with your health care provider. Document Revised: 06/14/2020 Document Reviewed: 06/14/2020 Elsevier Patient Education  2023 [employer].

## 2022-01-07 ENCOUNTER — Other Ambulatory Visit: Payer: Self-pay | Admitting: Family Medicine

## 2022-01-07 DIAGNOSIS — E785 Hyperlipidemia, unspecified: Secondary | ICD-10-CM

## 2022-01-07 DIAGNOSIS — I10 Essential (primary) hypertension: Secondary | ICD-10-CM

## 2022-01-08 ENCOUNTER — Other Ambulatory Visit: Payer: Self-pay | Admitting: Family Medicine

## 2022-01-08 DIAGNOSIS — E785 Hyperlipidemia, unspecified: Secondary | ICD-10-CM

## 2022-02-12 ENCOUNTER — Other Ambulatory Visit: Payer: Self-pay | Admitting: Family Medicine

## 2022-02-12 DIAGNOSIS — I10 Essential (primary) hypertension: Secondary | ICD-10-CM

## 2022-02-13 DIAGNOSIS — F1120 Opioid dependence, uncomplicated: Secondary | ICD-10-CM | POA: Diagnosis not present

## 2022-02-13 DIAGNOSIS — Z79899 Other long term (current) drug therapy: Secondary | ICD-10-CM | POA: Diagnosis not present

## 2022-02-13 DIAGNOSIS — Z0289 Encounter for other administrative examinations: Secondary | ICD-10-CM | POA: Diagnosis not present

## 2022-02-17 ENCOUNTER — Encounter: Payer: Self-pay | Admitting: Gastroenterology

## 2022-03-13 ENCOUNTER — Other Ambulatory Visit: Payer: Self-pay | Admitting: Family Medicine

## 2022-03-14 NOTE — Telephone Encounter (Signed)
Refill request Ambien Last refill 11/09/21 #30/1 Last office visit 01/03/22

## 2022-03-14 NOTE — Telephone Encounter (Signed)
ERx 

## 2022-03-28 ENCOUNTER — Ambulatory Visit (AMBULATORY_SURGERY_CENTER): Payer: Federal, State, Local not specified - PPO

## 2022-03-28 VITALS — Ht 73.0 in | Wt 220.0 lb

## 2022-03-28 DIAGNOSIS — Z1211 Encounter for screening for malignant neoplasm of colon: Secondary | ICD-10-CM

## 2022-03-28 MED ORDER — PEG 3350-KCL-NA BICARB-NACL 420 G PO SOLR: 4000.0000 mL | Freq: Once | ORAL | 0 refills | Status: AC

## 2022-03-28 NOTE — Progress Notes (Signed)

## 2022-04-03 ENCOUNTER — Other Ambulatory Visit: Payer: Self-pay | Admitting: Family Medicine

## 2022-04-04 NOTE — Telephone Encounter (Signed)
Robaxin Last filled:  03/06/22, #60 Last OV: 01/03/22, CPE Next OV:  01/08/23, CPE

## 2022-04-07 HISTORY — PX: COLONOSCOPY: SHX174

## 2022-04-11 ENCOUNTER — Encounter: Payer: Self-pay | Admitting: Gastroenterology

## 2022-04-11 ENCOUNTER — Ambulatory Visit (AMBULATORY_SURGERY_CENTER): Payer: Federal, State, Local not specified - PPO | Admitting: Gastroenterology

## 2022-04-11 VITALS — BP 121/64 | HR 52 | Temp 98.0°F | Resp 19 | Ht 73.0 in | Wt 220.0 lb

## 2022-04-11 DIAGNOSIS — Z1211 Encounter for screening for malignant neoplasm of colon: Secondary | ICD-10-CM

## 2022-04-11 MED ORDER — SODIUM CHLORIDE 0.9 % IV SOLN: 500.0000 mL | INTRAVENOUS | Status: DC

## 2022-04-11 NOTE — Progress Notes (Signed)
[facility] History and Physical   Primary Care Physician:  Ria Bush, MD   Reason for Procedure:   Colon cancer screening  Plan:    Screening colonoscopy     HPI: William Friedman is a 52 y.o. male undergoing initial average risk screening colonoscopy.  He has no family history of colon cancer and no chronic GI symptoms.    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 Authorizing Provider  amLODipine (NORVASC) 10 MG tablet TAKE 1 TABLET BY MOUTH EVERY DAY 01/09/22  Yes Ria Bush, MD  atorvastatin (LIPITOR) 40 MG tablet TAKE 1 TABLET BY MOUTH EVERY DAY 01/09/22  Yes Ria Bush, MD  benazepril (LOTENSIN) 10 MG tablet TAKE 1 TABLET BY MOUTH EVERY DAY 02/13/22  Yes Ria Bush, MD  Buprenorphine HCl-Naloxone HCl 2-0.5 MG FILM Place 1 tablet under the tongue every other day. 07/27/17  Yes Ria Bush, MD  buprenorphine-naloxone (SUBOXONE) 2-0.5 mg SUBL SL tablet Place 1 tablet under the tongue daily.   Yes [provider]  fenofibrate (TRICOR) 145 MG tablet TAKE 1/2 TABLETS (72.5 MG TOTAL) BY MOUTH DAILY. 01/09/22  Yes Ria Bush, MD  methocarbamol (ROBAXIN) 750 MG tablet TAKE 1 TABLET BY MOUTH THREE TIMES A DAY AS NEEDED FOR MUSCLE SPASM 04/06/22  Yes Ria Bush, MD  Multiple Vitamins-Minerals (MULTIVITAMIN  PO) Take 1 tablet by mouth daily.   Yes [provider]  zolpidem (AMBIEN) 10 MG tablet TAKE 1/2 TO 1 TABLET BY MOUTH AT BEDTIME AS NEEDED FOR SLEEP 03/14/22  Yes Ria Bush, MD    Current Outpatient Medications  Medication Sig Dispense Refill   amLODipine (NORVASC) 10 MG tablet TAKE 1 TABLET BY MOUTH EVERY DAY 90 tablet 3   atorvastatin (LIPITOR) 40 MG tablet TAKE 1 TABLET BY MOUTH EVERY DAY 90 tablet 3   benazepril (LOTENSIN) 10 MG tablet TAKE 1 TABLET BY MOUTH EVERY DAY 90 tablet 3   Buprenorphine HCl-Naloxone HCl 2-0.5 MG FILM Place 1 tablet under the tongue every other day.  0   buprenorphine-naloxone (SUBOXONE) 2-0.5 mg SUBL SL tablet Place 1 tablet under the tongue daily.     fenofibrate (TRICOR) 145 MG tablet TAKE 1/2 TABLETS (72.5 MG TOTAL) BY MOUTH DAILY. 45 tablet 3   methocarbamol (ROBAXIN) 750 MG tablet TAKE 1 TABLET BY MOUTH THREE TIMES A DAY AS NEEDED FOR MUSCLE SPASM 60 tablet 3   Multiple Vitamins-Minerals (MULTIVITAMIN PO) Take 1 tablet by mouth daily.     zolpidem (AMBIEN) 10 MG tablet TAKE 1/2 TO 1 TABLET BY MOUTH AT BEDTIME AS NEEDED FOR SLEEP 30 tablet 1   Current Facility-Administered Medications  Medication Dose Route Frequency Provider Last Rate Last Admin   0.9 %  sodium chloride  infusion  500 mL Intravenous Continuous Daryel November, MD        Allergies as of 04/11/2022   (No Known Allergies)    Family History  Problem Relation Age of Onset   Hypertension Mother    Cancer Mother        breast   Hypertension Father    Diabetes Father    Stroke Father 41       ministroke   Coronary artery disease Father 15       CABG   Cancer Sister        breast   Heart disease Maternal Grandmother    Coronary artery disease Maternal Grandfather 30   Cancer Cousin 11       Ewing sarcoma   Colon cancer Neg Hx    Colon polyps Neg Hx    Esophageal cancer Neg Hx    Rectal cancer Neg Hx    Stomach cancer Neg Hx     Social History    Socioeconomic History   Marital status: Married    Spouse name: Not on file   Number of children: Not on file   Years of education: Not on file   Highest education level: Not on file  Occupational History   Not on file  Tobacco Use   Smoking status: Never   Smokeless tobacco: Never  Substance and Sexual Activity   Alcohol use: No    Comment: rarely   Drug use: Yes    Types: Fentanyl    Comment: Opioid---Oxycodone, Hydrocodone   Sexual activity: Not on file  Other Topics Concern   Not on file  Social History Narrative   Caffeine: sodas Dr Malachi Bonds - 1L/day   Lives with wife and daughter, 1 dog   Occupation: special agent - [employer]   Edu: BS    Activity: golfing, likes sports.    Diet: poor - not regular fruits/vegetables, not regular water    Social Determinants of Health   Financial Resource Strain: Not on file  Food Insecurity: Not on file  Transportation Needs: Not on file  Physical Activity: Not on file  Stress: Not on file  Social Connections: Not on file  Intimate Partner Violence: Not on file    Review of Systems:  All other review of systems negative except as mentioned in the HPI.  Physical Exam: Vital signs BP 127/69   Pulse 61   Temp 98 F (36.7 C)   Resp (!) 8   Ht '6\' 1"'$  (1.854 m)   Wt 220 lb (99.8 kg)   SpO2 100%   BMI 29.03 kg/m   General:   Alert,  Well-developed, well-nourished, pleasant and cooperative in NAD Airway:  Mallampati 1 Lungs:  Clear throughout to auscultation.   Heart:  Regular rate and rhythm; no murmurs, clicks, rubs,  or gallops. Abdomen:  Soft, nontender and nondistended. Normal bowel sounds.   Neuro/Psych:  Normal mood and affect. A and O x 3   Sherran Margolis E. Candis Schatz, MD [facility]

## 2022-04-11 NOTE — Progress Notes (Signed)
Uneventful anesthetic. Report to pacu rn. Vss. Care resumed by rn.

## 2022-04-11 NOTE — Progress Notes (Signed)
Pt's states no medical or surgical changes since previsit or office visit. 

## 2022-04-11 NOTE — Patient Instructions (Signed)
Resume previous diet and continue present medications. Repeat colonoscopy in 10 years for surveillance!  YOU HAD AN ENDOSCOPIC PROCEDURE TODAY AT [facility]:   Refer to the procedure report that was given to you for any specific questions about what was found during the examination.  If the procedure report does not answer your questions, please call your gastroenterologist to clarify.  If you requested that your care partner not be given the details of your procedure findings, then the procedure report has been included in a sealed envelope for you to review at your convenience later.  YOU SHOULD EXPECT: Some feelings of bloating in the abdomen. Passage of more gas than usual.  Walking can help get rid of the air that was put into your GI tract during the procedure and reduce the bloating. If you had a lower endoscopy (such as a colonoscopy or flexible sigmoidoscopy) you may notice spotting of blood in your stool or on the toilet paper. If you underwent a bowel prep for your procedure, you may not have a normal bowel movement for a few days.  Please Note:  You might notice some irritation and congestion in your nose or some drainage.  This is from the oxygen used during your procedure.  There is no need for concern and it should clear up in a day or so.  SYMPTOMS TO REPORT IMMEDIATELY:  Following lower endoscopy (colonoscopy or flexible sigmoidoscopy):  Excessive amounts of blood in the stool  Significant tenderness or worsening of abdominal pains  Swelling of the abdomen that is new, acute  Fever of 100F or higher  For urgent or emergent issues, a gastroenterologist can be reached at any hour by calling [phone]. Do not use MyChart messaging for urgent concerns.    DIET:  We do recommend a small meal at first, but then you may proceed to your regular diet.  Drink plenty of fluids but you should avoid alcoholic beverages for 24 hours.  ACTIVITY:  You should plan to  take it easy for the rest of today and you should NOT DRIVE or use heavy machinery until tomorrow (because of the sedation medicines used during the test).    FOLLOW UP: Our staff will call the number listed on your records the next business day following your procedure.  We will call around 7:15- 8:00 am to check on you and address any questions or concerns that you may have regarding the information given to you following your procedure. If we do not reach you, we will leave a message.     If any biopsies were taken you will be contacted by phone or by letter within the next 1-3 weeks.  Please call us at [phone] if you have not heard about the biopsies in 3 weeks.    SIGNATURES/CONFIDENTIALITY: You and/or your care partner have signed paperwork which will be entered into your electronic medical record.  These signatures attest to the fact that that the information above on your After Visit Summary has been reviewed and is understood.  Full responsibility of the confidentiality of this discharge information lies with you and/or your care-partner.

## 2022-04-11 NOTE — Op Note (Signed)
William Friedman: William Friedman Procedure Date: 04/11/2022 1:35 PM MRN: [id number] Endoscopist: Nicki Reaper E. Candis Schatz , MD, [id number] Age: 52 Referring MD:  Date of Birth: 1970-05-14 Gender: Male Account #: [id number] Procedure:                Colonoscopy Indications:              Screening for colorectal malignant neoplasm, This                            is the patient's first colonoscopy Medicines:                Monitored Anesthesia Care Procedure:                Pre-Anesthesia Assessment:                           - Prior to the procedure, a History and Physical                            was performed, and patient medications and                            allergies were reviewed. The patient's tolerance of                            previous anesthesia was also reviewed. The risks                            and benefits of the procedure and the sedation                            options and risks were discussed with the patient.                            All questions were answered, and informed consent                            was obtained. Prior Anticoagulants: The patient has                            taken no anticoagulant or antiplatelet agents. ASA                            Grade Assessment: II - A patient with mild systemic                            disease. After reviewing the risks and benefits,                            the patient was deemed in satisfactory condition to                            undergo the procedure.  After obtaining informed consent, the colonoscope                            was passed under direct vision. Throughout the                            procedure, the patient's blood pressure, pulse, and                            oxygen saturations were monitored continuously. The                            CF [id number] was introduced through the anus                            and advanced  to the the terminal ileum, with                            identification of the appendiceal orifice and IC                            valve. The colonoscopy was performed without                            difficulty. The patient tolerated the procedure                            well. The quality of the bowel preparation was                            good. The terminal ileum, ileocecal valve,                            appendiceal orifice, and rectum were photographed. Scope In: 1:44:56 PM Scope Out: 1:57:11 PM Scope Withdrawal Time: 0 hours 8 minutes 20 seconds  Total Procedure Duration: 0 hours 12 minutes 15 seconds  Findings:                 The perianal and digital rectal examinations were                            normal. Pertinent negatives include normal                            sphincter tone and no palpable rectal lesions.                           The colon (entire examined portion) appeared normal.                           The terminal ileum appeared normal.                           The retroflexed view of the distal rectum and anal  verge was normal and showed no anal or rectal                            abnormalities. Complications:            No immediate complications. Estimated Blood Loss:     Estimated blood loss: none. Impression:               - The entire examined colon is normal.                           - The examined portion of the ileum was normal.                           - The distal rectum and anal verge are normal on                            retroflexion view.                           - No specimens collected. Recommendation:           - Patient has a contact number available for                            emergencies. The signs and symptoms of potential                            delayed complications were discussed with the                            patient. Return to normal activities tomorrow.                             Written discharge instructions were provided to the                            patient.                           - Resume previous diet.                           - Repeat colonoscopy in 10 years for screening                            purposes. William Friedman E. Candis Schatz, MD 04/11/2022 2:02:42 PM This report has been signed electronically.

## 2022-04-12 ENCOUNTER — Telehealth: Payer: Self-pay

## 2022-04-12 ENCOUNTER — Encounter: Payer: Self-pay | Admitting: Family Medicine

## 2022-04-12 NOTE — Telephone Encounter (Signed)
Left message on follow up call. 

## 2022-04-17 DIAGNOSIS — Z0289 Encounter for other administrative examinations: Secondary | ICD-10-CM | POA: Diagnosis not present

## 2022-04-17 DIAGNOSIS — Z79899 Other long term (current) drug therapy: Secondary | ICD-10-CM | POA: Diagnosis not present

## 2022-04-17 DIAGNOSIS — F1120 Opioid dependence, uncomplicated: Secondary | ICD-10-CM | POA: Diagnosis not present

## 2022-06-15 DIAGNOSIS — Z0289 Encounter for other administrative examinations: Secondary | ICD-10-CM | POA: Diagnosis not present

## 2022-06-15 DIAGNOSIS — Z79899 Other long term (current) drug therapy: Secondary | ICD-10-CM | POA: Diagnosis not present

## 2022-06-15 DIAGNOSIS — F1120 Opioid dependence, uncomplicated: Secondary | ICD-10-CM | POA: Diagnosis not present

## 2022-06-29 ENCOUNTER — Other Ambulatory Visit: Payer: Self-pay | Admitting: Family Medicine

## 2022-06-29 DIAGNOSIS — F5104 Psychophysiologic insomnia: Secondary | ICD-10-CM

## 2022-06-29 NOTE — Telephone Encounter (Signed)
Name of Medication: Ambien Name of Pharmacy: [facility] Last Fill or Written Date and Quantity: 05/07/22, #30 Last Office Visit and Type: 01/03/21, CPE Next Office Visit and Type: 01/08/23, CPE Last Controlled Substance Agreement Date: none Last UDS: none

## 2022-07-04 NOTE — Telephone Encounter (Signed)
ERx 

## 2022-07-31 ENCOUNTER — Other Ambulatory Visit: Payer: Self-pay | Admitting: Family Medicine

## 2022-07-31 DIAGNOSIS — G894 Chronic pain syndrome: Secondary | ICD-10-CM

## 2022-08-02 NOTE — Telephone Encounter (Signed)
Patient contacted the office for a follow up on this refill request

## 2022-08-02 NOTE — Telephone Encounter (Signed)
Robaxin Last filled:  06/29/22, #60 Last OV:  01/03/22, CPE Next OV:  01/08/23, CPE

## 2022-08-03 NOTE — Telephone Encounter (Signed)
Left message on vm per dpr notifying pt refill was sent in.  

## 2022-08-03 NOTE — Telephone Encounter (Signed)
ERx ?Plz notify pt. ?

## 2022-08-08 DIAGNOSIS — K080 Exfoliation of teeth due to systemic causes: Secondary | ICD-10-CM | POA: Diagnosis not present

## 2022-08-14 DIAGNOSIS — F1120 Opioid dependence, uncomplicated: Secondary | ICD-10-CM | POA: Diagnosis not present

## 2022-08-14 DIAGNOSIS — Z5181 Encounter for therapeutic drug level monitoring: Secondary | ICD-10-CM | POA: Diagnosis not present

## 2022-08-25 ENCOUNTER — Encounter (INDEPENDENT_AMBULATORY_CARE_PROVIDER_SITE_OTHER): Payer: Federal, State, Local not specified - PPO | Admitting: Family Medicine

## 2022-08-25 DIAGNOSIS — R5383 Other fatigue: Secondary | ICD-10-CM

## 2022-08-25 DIAGNOSIS — E291 Testicular hypofunction: Secondary | ICD-10-CM

## 2022-08-29 NOTE — Telephone Encounter (Signed)
Patient called in regarding this message that he sent on 08/25/2022. He hasn't received a response back .

## 2022-08-30 DIAGNOSIS — E291 Testicular hypofunction: Secondary | ICD-10-CM | POA: Diagnosis not present

## 2022-08-30 NOTE — Telephone Encounter (Signed)
Please see the MyChart message reply(ies) for my assessment and plan.  The patient gave consent for this Medical Advice Message and is aware that it may result in a bill to their insurance company as well as the possibility that this may result in a co-payment or deductible. They are an established patient, but are not seeking medical advice exclusively about a problem treated during an in person or video visit in the last 7 days. I did not recommend an in person or video visit within 7 days of my reply.  Current symptoms include: lethargic with low energy, my libido is practically non-existent, and I seem to get tired more quickly when exercising   H/o low T levels in the past, didn't seem to benefit from T treatment previously (2014).  Reasonable to recheck levels as well as for other causes of fatigue. Labs ordered. He will call to schedule appt.   I spent a total of 6 minutes cumulative time within 7 days through MyChart messaging Eustaquio Boyden, MD

## 2022-09-05 ENCOUNTER — Other Ambulatory Visit (INDEPENDENT_AMBULATORY_CARE_PROVIDER_SITE_OTHER): Payer: Federal, State, Local not specified - PPO

## 2022-09-05 DIAGNOSIS — R5383 Other fatigue: Secondary | ICD-10-CM

## 2022-09-05 DIAGNOSIS — E291 Testicular hypofunction: Secondary | ICD-10-CM | POA: Diagnosis not present

## 2022-09-05 LAB — CBC WITH DIFFERENTIAL/PLATELET
Basophils Absolute: 0 10*3/uL (ref 0.0–0.1)
Basophils Relative: 0.7 % (ref 0.0–3.0)
Eosinophils Absolute: 0.2 10*3/uL (ref 0.0–0.7)
Eosinophils Relative: 4.6 % (ref 0.0–5.0)
HCT: 38.1 % — ABNORMAL LOW (ref 39.0–52.0)
Hemoglobin: 12.8 g/dL — ABNORMAL LOW (ref 13.0–17.0)
Lymphocytes Relative: 38.9 % (ref 12.0–46.0)
Lymphs Abs: 1.9 10*3/uL (ref 0.7–4.0)
MCHC: 33.5 g/dL (ref 30.0–36.0)
MCV: 86.2 fl (ref 78.0–100.0)
Monocytes Absolute: 0.4 10*3/uL (ref 0.1–1.0)
Monocytes Relative: 8.9 % (ref 3.0–12.0)
Neutro Abs: 2.3 10*3/uL (ref 1.4–7.7)
Neutrophils Relative %: 46.9 % (ref 43.0–77.0)
Platelets: 362 10*3/uL (ref 150.0–400.0)
RBC: 4.42 Mil/uL (ref 4.22–5.81)
RDW: 13.2 % (ref 11.5–15.5)
WBC: 4.8 10*3/uL (ref 4.0–10.5)

## 2022-09-05 LAB — VITAMIN D 25 HYDROXY (VIT D DEFICIENCY, FRACTURES): VITD: 37.43 ng/mL (ref 30.00–100.00)

## 2022-09-05 LAB — VITAMIN B12: Vitamin B-12: 355 pg/mL (ref 211–911)

## 2022-09-05 LAB — TSH: TSH: 1.61 u[IU]/mL (ref 0.35–5.50)

## 2022-09-05 LAB — T4, FREE: Free T4: 0.93 ng/dL (ref 0.60–1.60)

## 2022-09-12 NOTE — Telephone Encounter (Signed)
Late entry - replied via result note.

## 2022-09-27 ENCOUNTER — Other Ambulatory Visit: Payer: Self-pay

## 2022-09-27 ENCOUNTER — Emergency Department
Admission: EM | Admit: 2022-09-27 | Discharge: 2022-09-27 | Disposition: A | Discharge status: Home / Self Care | Payer: Federal, State, Local not specified - PPO | Attending: Emergency Medicine | Admitting: Emergency Medicine

## 2022-09-27 DIAGNOSIS — T18128A Food in esophagus causing other injury, initial encounter: Secondary | ICD-10-CM | POA: Insufficient documentation

## 2022-09-27 DIAGNOSIS — W44F3XA Food entering into or through a natural orifice, initial encounter: Secondary | ICD-10-CM | POA: Insufficient documentation

## 2022-09-27 MED ORDER — GLUCAGON HCL RDNA (DIAGNOSTIC) 1 MG IJ SOLR
1.0000 mg | Freq: Once | INTRAMUSCULAR | Status: AC
  Administered 2022-09-27: 1 mg via INTRAMUSCULAR
  Filled 2022-09-27: qty 1

## 2022-09-27 NOTE — ED Triage Notes (Signed)
Pt reports he was eating chicken around 1 hour ago and feels like the chicken is caught in his throat. Reports when trying to drink it "collects in my throat and I have to spit it back up". Reports difficulty breathing.

## 2022-09-27 NOTE — ED Provider Notes (Signed)
[facility] Provider Note    Event Date/Time   First MD Initiated Contact with Patient 09/27/22 1911     (approximate)   History   Foreign Body   HPI  William Friedman is a 52 y.o. male who presents to the emergency department today because of concerns for possible food impaction.  Patient was eating chicken when he felt it get stuck in his esophagus.  He tried drinking some water at home but it came right back up.  He also tried to induce vomiting but did not dislodge the blockage.  He says that he has had trouble once in the past with some steak although he was able to vomit that up.     Physical Exam   Triage Vital Signs: ED Triage Vitals  Encounter Vitals Group     BP 09/27/22 1909 (!) 158/94     Systolic BP Percentile --      Diastolic BP Percentile --      Pulse Rate 09/27/22 1909 100     Resp 09/27/22 1909 18     Temp 09/27/22 1909 98.6 F (37 C)     Temp Source 09/27/22 1909 Oral     SpO2 09/27/22 1909 100 %     Weight 09/27/22 1908 225 lb (102.1 kg)     Height 09/27/22 1908 6\' 2"  (1.88 m)     Head Circumference --      Peak Flow --      Pain Score 09/27/22 1908 0     Pain Loc --      Pain Education --      Exclude from Growth Chart --     Most recent vital signs: Vitals:   09/27/22 1909  BP: (!) 158/94  Pulse: 100  Resp: 18  Temp: 98.6 F (37 C)  SpO2: 100%   General: Awake, alert, oriented. CV:  Good peripheral perfusion. Regular rate and rhythm. Resp:  Normal effort. Lungs clear. Abd:  No distention.   ED Results / Procedures / Treatments   Labs (all labs ordered are listed, but only abnormal results are displayed) Labs Reviewed - No data to display   EKG  None   RADIOLOGY None   PROCEDURES:  Critical Care performed: No   MEDICATIONS ORDERED IN ED: Medications  glucagon (human recombinant) (GLUCAGEN) injection 1 mg (1 mg Intramuscular Given 09/27/22 1923)     IMPRESSION / MDM / ASSESSMENT AND  PLAN / ED COURSE  I reviewed the triage vital signs and the nursing notes.                              Differential diagnosis includes, but is not limited to, food impaction, sensation of food impaction  Patient's presentation is most consistent with acute presentation with potential threat to life or bodily function.  Patient presented to the emergency department today with concern for food impaction.  Patient was given glucagon.  He did feel like the impaction moved while here in the emergency department.  He was then able to swallow without any difficulty.  Will plan on discharging.  Discussed with patient importance of follow-up with GI.     FINAL CLINICAL IMPRESSION(S) / ED DIAGNOSES   Final diagnoses:  Food impaction of esophagus, initial encounter      Note:  This document was prepared using Dragon voice recognition software and may include unintentional dictation errors.    Derrill Kay,  Dustin Flock, MD 09/27/22 2037

## 2022-09-27 NOTE — ED Notes (Signed)
Patient states he feels as though food bolus has passed and he is now able to drink without discomfort

## 2022-09-27 NOTE — Discharge Instructions (Signed)
Please call GI to make an appointment. Please seek medical attention for any high fevers, chest pain, shortness of breath, change in behavior, persistent vomiting, bloody stool or any other new or concerning symptoms.

## 2022-09-28 DIAGNOSIS — K219 Gastro-esophageal reflux disease without esophagitis: Secondary | ICD-10-CM | POA: Diagnosis not present

## 2022-09-28 DIAGNOSIS — R1314 Dysphagia, pharyngoesophageal phase: Secondary | ICD-10-CM | POA: Diagnosis not present

## 2022-10-02 ENCOUNTER — Other Ambulatory Visit: Payer: Self-pay | Admitting: Otolaryngology

## 2022-10-02 ENCOUNTER — Telehealth: Payer: Self-pay

## 2022-10-02 DIAGNOSIS — R1314 Dysphagia, pharyngoesophageal phase: Secondary | ICD-10-CM

## 2022-10-02 DIAGNOSIS — K219 Gastro-esophageal reflux disease without esophagitis: Secondary | ICD-10-CM

## 2022-10-02 NOTE — Transitions of Care (Post Inpatient/ED Visit) (Signed)
10/02/2022  Name: William Friedman MRN: [id number] DOB: [birth year]  Today's TOC FU Call Status: Today's TOC FU Call Status:: Successful TOC FU Call Completed TOC FU Call Complete Date: 10/02/22 Patient's Name and Date of Birth confirmed.  Red on EMMI-ED Discharge Alert Date & Reason:09/29/22 "Scheduled follow-up appt? No"  Transition Care Management Follow-up Telephone Call Date of Discharge: 09/27/22 Discharge Facility: [facility]) Type of Discharge: Emergency Department Reason for ED Visit: Other: ('food imapction of esophagus") How have you been since you were released from the hospital?: Better (Pt voices he is doigng well. No further issues with swallowing-has been able to eat and drink just fine. States he has been referred for swallow test and awaiting office to call him with appt date & time.) Any questions or concerns?: No  Items Reviewed: Did you receive and understand the discharge instructions provided?: Yes Medications obtained,verified, and reconciled?: No Medications Not Reviewed Reasons:: Other: (no med changes-pt declined) Any new allergies since your discharge?: No Dietary orders reviewed?: Yes Type of Diet Ordered:: low salt/heart healthy Do you have support at home?: Yes People in Home: spouse Name of Support/Comfort Primary Source: Mary  Medications Reviewed Today: Medications Reviewed Today     Reviewed by Charlyn Minerva, RN (Registered Nurse) on 10/02/22 at 1015  Med List Status: <None>   Medication Order Taking? Sig Documenting Provider Last Dose Status Informant  amLODipine (NORVASC) 10 MG tablet [id number] No TAKE 1 TABLET BY MOUTH EVERY DAY Eustaquio Boyden, MD 04/10/2022 Active   atorvastatin (LIPITOR) 40 MG tablet [id number] No TAKE 1 TABLET BY MOUTH EVERY DAY Eustaquio Boyden, MD 04/10/2022 Active   benazepril (LOTENSIN) 10 MG tablet [id number] No TAKE 1 TABLET BY MOUTH EVERY DAY Eustaquio Boyden, MD  04/10/2022 Active   Buprenorphine HCl-Naloxone HCl 2-0.5 MG FILM [id number] No Place 1 tablet under the tongue every other day. Eustaquio Boyden, MD 04/10/2022 Active   buprenorphine-naloxone (SUBOXONE) 2-0.5 mg SUBL SL tablet [id number] No Place 1 tablet under the tongue daily. [provider] 04/10/2022 Active   fenofibrate (TRICOR) 145 MG tablet [id number] No TAKE 1/2 TABLETS (72.5 MG TOTAL) BY MOUTH DAILY. Eustaquio Boyden, MD Past Week Active   methocarbamol (ROBAXIN) 750 MG tablet [id number]  TAKE 1 TABLET BY MOUTH THREE TIMES A DAY AS NEEDED FOR MUSCLE SPASM Eustaquio Boyden, MD  Active   Multiple Vitamins-Minerals (MULTIVITAMIN PO) [id number] No Take 1 tablet by mouth daily. [provider] 04/10/2022 Active Self  zolpidem (AMBIEN) 10 MG tablet [id number]  TAKE 1/2 TO 1 TABLET BY MOUTH AT BEDTIME AS NEEDED FOR SLEEP Eustaquio Boyden, MD  Active             Home Care and Equipment/Supplies: Were Home Health Services Ordered?: NA Any new equipment or medical supplies ordered?: NA  Functional Questionnaire: Do you need assistance with bathing/showering or dressing?: No Do you need assistance with meal preparation?: No Do you need assistance with eating?: No Do you have difficulty maintaining continence: No Do you need assistance with getting out of bed/getting out of a chair/moving?: No Do you have difficulty managing or taking your medications?: No  Follow up appointments reviewed: PCP Follow-up appointment confirmed?: No MD Provider Line [phone] Given: No Specialist Hospital Follow-up appointment confirmed?: Yes Date of Specialist follow-up appointment?: 09/28/22 Follow-Up Specialty Provider:: Dr. Pollyann Kennedy Do you need transportation to your follow-up appointment?: No Do you understand care options if your condition(s) worsen?: Yes-patient verbalized understanding  SDOH Interventions Today    Flowsheet  Row Most Recent Value  SDOH Interventions   Food  Insecurity Interventions Intervention Not Indicated  Transportation Interventions Intervention Not Indicated       TOC Interventions Today    Flowsheet Row Most Recent Value  TOC Interventions   TOC Interventions Discussed/Reviewed TOC Interventions Discussed      Interventions Today    Flowsheet Row Most Recent Value  General Interventions   General Interventions Discussed/Reviewed General Interventions Discussed, Doctor Visits  Doctor Visits Discussed/Reviewed Doctor Visits Discussed, PCP, Specialist  PCP/Specialist Visits Compliance with follow-up visit  Education Interventions   Education Provided Provided Education  Provided Verbal Education On When to see the doctor, Other, Nutrition  Nutrition Interventions   Nutrition Discussed/Reviewed Nutrition Discussed  Pharmacy Interventions   Pharmacy Dicussed/Reviewed Pharmacy Topics Discussed  Safety Interventions   Safety Discussed/Reviewed Safety Discussed      Alessandra Grout [facility] Care Management Care Management Community Coordinator Direct Phone: [phone] Toll Free: [phone] Fax: [phone]

## 2022-10-04 ENCOUNTER — Encounter: Payer: Self-pay | Admitting: Family Medicine

## 2022-10-04 ENCOUNTER — Encounter: Payer: Self-pay | Admitting: *Deleted

## 2022-10-04 ENCOUNTER — Ambulatory Visit: Payer: Federal, State, Local not specified - PPO | Admitting: Family Medicine

## 2022-10-04 VITALS — BP 122/78 | HR 67 | Temp 97.4°F | Ht 74.0 in | Wt 231.8 lb

## 2022-10-04 DIAGNOSIS — Z8249 Family history of ischemic heart disease and other diseases of the circulatory system: Secondary | ICD-10-CM

## 2022-10-04 DIAGNOSIS — E291 Testicular hypofunction: Secondary | ICD-10-CM

## 2022-10-04 DIAGNOSIS — R5382 Chronic fatigue, unspecified: Secondary | ICD-10-CM | POA: Diagnosis not present

## 2022-10-04 DIAGNOSIS — Z23 Encounter for immunization: Secondary | ICD-10-CM | POA: Diagnosis not present

## 2022-10-04 DIAGNOSIS — E785 Hyperlipidemia, unspecified: Secondary | ICD-10-CM

## 2022-10-04 LAB — POC URINALSYSI DIPSTICK (AUTOMATED)
Bilirubin, UA: NEGATIVE
Blood, UA: NEGATIVE
Glucose, UA: NEGATIVE
Ketones, UA: NEGATIVE
Leukocytes, UA: NEGATIVE
Nitrite, UA: NEGATIVE
Protein, UA: NEGATIVE
Spec Grav, UA: 1.025 (ref 1.010–1.025)
Urobilinogen, UA: 0.2 E.U./dL
pH, UA: 6 (ref 5.0–8.0)

## 2022-10-04 LAB — COMPREHENSIVE METABOLIC PANEL
ALT: 21 U/L (ref 0–53)
AST: 17 U/L (ref 0–37)
Albumin: 4.4 g/dL (ref 3.5–5.2)
Alkaline Phosphatase: 44 U/L (ref 39–117)
BUN: 27 mg/dL — ABNORMAL HIGH (ref 6–23)
CO2: 26 mEq/L (ref 19–32)
Calcium: 9.3 mg/dL (ref 8.4–10.5)
Chloride: 106 mEq/L (ref 96–112)
Creatinine, Ser: 1.26 mg/dL (ref 0.40–1.50)
GFR: 64.95 mL/min (ref >60.00)
Glucose, Bld: 101 mg/dL — ABNORMAL HIGH (ref 70–99)
Potassium: 4.1 mEq/L (ref 3.5–5.1)
Sodium: 139 mEq/L (ref 135–145)
Total Bilirubin: 0.4 mg/dL (ref 0.2–1.2)
Total Protein: 7 g/dL (ref 6.0–8.3)

## 2022-10-04 LAB — TESTOSTERONE: Testosterone: 279.17 ng/dL — ABNORMAL LOW (ref 300.00–890.00)

## 2022-10-04 LAB — FOLLICLE STIMULATING HORMONE: FSH: 6.8 m[IU]/mL (ref 1.4–18.1)

## 2022-10-04 LAB — IBC PANEL
Iron: 97 ug/dL (ref 42–165)
Saturation Ratios: 20.8 % (ref 20.0–50.0)
TIBC: 466.2 ug/dL — ABNORMAL HIGH (ref 250.0–450.0)
Transferrin: 333 mg/dL (ref 212.0–360.0)

## 2022-10-04 LAB — LUTEINIZING HORMONE: LH: 4.13 m[IU]/mL (ref 1.50–9.30)

## 2022-10-04 MED ORDER — TESTOSTERONE CYPIONATE 100 MG/ML IM SOLN: 200.0000 mg | INTRAMUSCULAR | 0 refills | Status: DC

## 2022-10-04 NOTE — Assessment & Plan Note (Signed)
He continues fibrate and statin.

## 2022-10-04 NOTE — Progress Notes (Signed)
Ph: [phone] Fax: [phone]   Patient ID: William Friedman, male    DOB: 12/20/1970, 52 y.o.   MRN: [id number]  This visit was conducted in person.  BP 122/78   Pulse 67   Temp (!) 97.4 F (36.3 C) (Temporal)   Ht 6\' 2"  (1.88 m)   Wt 231 lb 12.8 oz (105.1 kg)   SpO2 99%   BMI 29.76 kg/m    CC: discuss labs  Subjective:   HPI: William Friedman is a 52 y.o. male presenting on 10/04/2022 for Discuss Lab work  and Fatigue   Recent concern for worsening fatigue. Labs showed low normal testosterone 298 (range [phone]) with normal free T at 45 (range 35-155), elevated SHBG 52.9. He also had mild anemia 12.8. Other labs were ok.   Notes decreased energy levels over the last several months, associated with decreased libido and weight gain, mood overall ok.  Alcohol - 2-3 mixed drinks twice weekly.   He previously tried testosterone boosters earlier in the year.   Previous levels very low 180-200 (2014) - topical creams at that time were costly and didn't really help.  He did restart vitamin B12 replacement daily over last several weeks without much change.      Relevant past medical, surgical, family and social history reviewed and updated as indicated. Interim medical history since our last visit reviewed. Allergies and medications reviewed and updated. Outpatient Medications Prior to Visit  Medication Sig Dispense Refill   amLODipine (NORVASC) 10 MG tablet TAKE 1 TABLET BY MOUTH EVERY DAY 90 tablet 3   atorvastatin (LIPITOR) 40 MG tablet TAKE 1 TABLET BY MOUTH EVERY DAY 90 tablet 3   benazepril (LOTENSIN) 10 MG tablet TAKE 1 TABLET BY MOUTH EVERY DAY 90 tablet 3   Buprenorphine HCl-Naloxone HCl 2-0.5 MG FILM Place 1 tablet under the tongue every other day.  0   fenofibrate (TRICOR) 145 MG tablet TAKE 1/2 TABLETS (72.5 MG TOTAL) BY MOUTH DAILY. 45 tablet 3   methocarbamol (ROBAXIN) 750 MG tablet TAKE 1 TABLET BY MOUTH THREE TIMES A DAY AS NEEDED FOR  MUSCLE SPASM 60 tablet 3   Multiple Vitamins-Minerals (MULTIVITAMIN PO) Take 1 tablet by mouth daily.     zolpidem (AMBIEN) 10 MG tablet TAKE 1/2 TO 1 TABLET BY MOUTH AT BEDTIME AS NEEDED FOR SLEEP 30 tablet 1   buprenorphine-naloxone (SUBOXONE) 2-0.5 mg SUBL SL tablet Place 1 tablet under the tongue daily.     No facility-administered medications prior to visit.     Per HPI unless specifically indicated in ROS section below Review of Systems  Objective:  BP 122/78   Pulse 67   Temp (!) 97.4 F (36.3 C) (Temporal)   Ht 6\' 2"  (1.88 m)   Wt 231 lb 12.8 oz (105.1 kg)   SpO2 99%   BMI 29.76 kg/m   Wt Readings from Last 3 Encounters:  10/04/22 231 lb 12.8 oz (105.1 kg)  09/27/22 225 lb (102.1 kg)  04/11/22 220 lb (99.8 kg)      Physical Exam Vitals and nursing note reviewed.  Constitutional:      Appearance: Normal appearance. He is not ill-appearing.  HENT:     Head: Normocephalic and atraumatic.     Mouth/Throat:     Mouth: Mucous membranes are moist.     Pharynx: Oropharynx is clear. No oropharyngeal exudate or posterior oropharyngeal erythema.  Eyes:     Extraocular Movements: Extraocular movements intact.  Conjunctiva/sclera: Conjunctivae normal.     Pupils: Pupils are equal, round, and reactive to light.  Cardiovascular:     Rate and Rhythm: Normal rate and regular rhythm.     Pulses: Normal pulses.     Heart sounds: Normal heart sounds. No murmur heard. Pulmonary:     Effort: Pulmonary effort is normal. No respiratory distress.     Breath sounds: Normal breath sounds. No wheezing, rhonchi or rales.  Abdominal:     General: Bowel sounds are normal. There is no distension.     Palpations: Abdomen is soft. There is no mass.     Tenderness: There is no abdominal tenderness. There is no guarding or rebound.     Hernia: No hernia is present.  Musculoskeletal:     Right lower leg: No edema.     Left lower leg: No edema.  Skin:    General: Skin is warm and dry.      Findings: No rash.  Neurological:     Mental Status: He is alert.  Psychiatric:        Mood and Affect: Mood normal.        Behavior: Behavior normal.       Results for orders placed or performed in visit on 10/04/22  POCT Urinalysis Dipstick (Automated)  Result Value Ref Range   Color, UA yellow    Clarity, UA clear    Glucose, UA Negative Negative   Bilirubin, UA Negative    Ketones, UA Negative    Spec Grav, UA 1.025 1.010 - 1.025   Blood, UA Negative    pH, UA 6.0 5.0 - 8.0   Protein, UA Negative Negative   Urobilinogen, UA 0.2 0.2 or 1.0 E.U./dL   Nitrite, UA Negative    Leukocytes, UA Negative Negative   Lab Results  Component Value Date   PSA 0.42 12/27/2021   PSA 0.53 01/03/2021   PSA 0.49 10/29/2019   Lab Results  Component Value Date   CHOL 171 12/27/2021   HDL 50.90 12/27/2021   LDLCALC 84 12/27/2021   LDLDIRECT 144.0 12/15/2015   TRIG 181.0 (H) 12/27/2021   CHOLHDL 3 12/27/2021    Assessment & Plan:   Problem List Items Addressed This Visit     Dyslipidemia    He continues fibrate and statin.       Relevant Orders   CT CARDIAC SCORING (SELF PAY ONLY)   Hypogonadism male - Primary    H/o marked low T levels 2014 (180-200).  Recently T 298 in setting of OTC testosterone booster use, now off this for the past 1.5 months. Update T levels along with other hormonal evaluation (LH, FSH, iron panel).  Reviewed benefits of testosterone replacement such as increased energy, libido, as well as risks of treatment including but not limited to increased cardiovascular risk, blood clot risk, possible effect on prostate cancer, hepatotoxicity and need to avoid secondary exposure in household members. Reviewed need to monitor prostate, cholesterol, blood counts, liver etc. Reviewed routes of administration and preferred topical replacement given it best matches physiologic testosterone effect.  After discussion he would like to start testosterone injections - will  bring Rx to nurse visit for injection teaching.       Relevant Orders   Testosterone   POCT Urinalysis Dipstick (Automated) (Completed)   IBC panel   Prolactin   Luteinizing hormone   Follicle stimulating hormone   Comprehensive metabolic panel   Family history of early CAD    Continue statin.  Lpa reassuringly <10 (12/2021). Discussed this, esp in setting of starting T replacement - offered checking coronary CT with calcium score - he agrees.       Relevant Orders   CT CARDIAC SCORING (SELF PAY ONLY)   Chronic fatigue    Has started B12 replacement without noted benefit. ?related to low T - see below.       Relevant Orders   POCT Urinalysis Dipstick (Automated) (Completed)   Other Visit Diagnoses     Immunization due       Relevant Orders   Zoster Recombinant (Shingrix ) (Completed)   Need for influenza vaccination       Relevant Orders   Flu vaccine trivalent PF, 6mos and older(Flulaval,Afluria,Fluarix,Fluzone) (Completed)        Meds ordered this encounter  Medications   testosterone cypionate (DEPOTESTOTERONE CYPIONATE) 100 MG/ML injection    Sig: Inject 2 mLs (200 mg total) into the muscle every 14 (fourteen) days. For IM use only    Dispense:  10 mL    Refill:  0    Orders Placed This Encounter  Procedures   CT CARDIAC SCORING (SELF PAY ONLY)    Standing Status:   Future    Standing Expiration Date:   10/04/2023    Order Specific Question:   Preferred imaging location?    Answer:   [facility]   Zoster Recombinant (Shingrix )   Flu vaccine trivalent PF, 6mos and older(Flulaval,Afluria,Fluarix,Fluzone)   Testosterone   IBC panel   Prolactin   Luteinizing hormone   Follicle stimulating hormone   Comprehensive metabolic panel   POCT Urinalysis Dipstick (Automated)    Patient Instructions  Recheck testosterone today.  Check urinalysis today  Second shingrix shot today and flu shot today.  Ok to start testosterone injections every 2 weeks.  Pick up prescription and schedule nurse visit for first injection. Keep November appointment.   Follow up plan: Return if symptoms worsen or fail to improve.  Eustaquio Boyden, MD

## 2022-10-04 NOTE — Assessment & Plan Note (Signed)
Has started B12 replacement without noted benefit. ?related to low T - see below.

## 2022-10-04 NOTE — Patient Instructions (Addendum)
Recheck testosterone today.  Check urinalysis today  Second shingrix shot today and flu shot today.  Ok to start testosterone injections every 2 weeks. Pick up prescription and schedule nurse visit for first injection. Keep November appointment.

## 2022-10-04 NOTE — Assessment & Plan Note (Signed)
Continue statin. Lpa reassuringly <10 (12/2021). Discussed this, esp in setting of starting T replacement - offered checking coronary CT with calcium score - he agrees.

## 2022-10-04 NOTE — Assessment & Plan Note (Signed)
H/o marked low T levels 2014 (180-200).  Recently T 298 in setting of OTC testosterone booster use, now off this for the past 1.5 months. Update T levels along with other hormonal evaluation (LH, FSH, iron panel).  Reviewed benefits of testosterone replacement such as increased energy, libido, as well as risks of treatment including but not limited to increased cardiovascular risk, blood clot risk, possible effect on prostate cancer, hepatotoxicity and need to avoid secondary exposure in household members. Reviewed need to monitor prostate, cholesterol, blood counts, liver etc. Reviewed routes of administration and preferred topical replacement given it best matches physiologic testosterone effect.  After discussion he would like to start testosterone injections - will bring Rx to nurse visit for injection teaching.

## 2022-10-05 ENCOUNTER — Ambulatory Visit
Admission: RE | Admit: 2022-10-05 | Discharge: 2022-10-05 | Disposition: A | Discharge status: Home / Self Care | Payer: Federal, State, Local not specified - PPO | Source: Ambulatory Visit | Attending: Otolaryngology | Admitting: Otolaryngology

## 2022-10-05 DIAGNOSIS — R1310 Dysphagia, unspecified: Secondary | ICD-10-CM | POA: Diagnosis not present

## 2022-10-05 DIAGNOSIS — K219 Gastro-esophageal reflux disease without esophagitis: Secondary | ICD-10-CM

## 2022-10-05 DIAGNOSIS — R1314 Dysphagia, pharyngoesophageal phase: Secondary | ICD-10-CM

## 2022-10-05 LAB — PROLACTIN: Prolactin: 11.7 ng/mL (ref 2.0–18.0)

## 2022-10-06 ENCOUNTER — Telehealth: Payer: Self-pay | Admitting: Family Medicine

## 2022-10-06 NOTE — Telephone Encounter (Signed)
Patient insurance company needs a PA for medication testosterone cypionate (DEPOTESTOTERONE CYPIONATE) 100 MG/ML injection

## 2022-10-10 ENCOUNTER — Encounter: Payer: Self-pay | Admitting: Family Medicine

## 2022-10-10 ENCOUNTER — Telehealth: Payer: Self-pay

## 2022-10-10 ENCOUNTER — Other Ambulatory Visit (HOSPITAL_COMMUNITY): Payer: Self-pay

## 2022-10-10 NOTE — Telephone Encounter (Signed)
PA request has been Approved. New Encounter created for follow up. For additional info see Pharmacy Prior Auth telephone encounter from 10/10/22.

## 2022-10-10 NOTE — Telephone Encounter (Signed)
Pharmacy Patient Advocate Encounter   Received notification from Pt Calls Messages that prior authorization for Testosterone Cypionate 100MG /ML intramuscular solution is required/requested.   Insurance verification completed.   The patient is insured through [facility] .   Per test claim: APPROVED from 09/10/22 to 04/08/23. Ran test claim, Copay is $33.71. This test claim was processed through [facility]- copay amounts may vary at other pharmacies due to pharmacy/plan contracts, or as the patient moves through the different stages of their insurance plan.

## 2022-10-24 ENCOUNTER — Other Ambulatory Visit: Payer: Self-pay | Admitting: Family Medicine

## 2022-10-24 DIAGNOSIS — F5104 Psychophysiologic insomnia: Secondary | ICD-10-CM

## 2022-10-25 NOTE — Telephone Encounter (Signed)
Name of Medication:  Ambien Name of Pharmacy:  [facility] Last Fill or Written Date and Quantity:  08/29/22, #30 Last Office Visit and Type:  10/04/22, hypogonadism male f/u Next Office Visit and Type:  01/08/23, CPE Last Controlled Substance Agreement Date: none Last UDS: none

## 2022-10-27 ENCOUNTER — Telehealth: Payer: Self-pay | Admitting: Family Medicine

## 2022-10-27 DIAGNOSIS — F5104 Psychophysiologic insomnia: Secondary | ICD-10-CM

## 2022-10-27 MED ORDER — ZOLPIDEM TARTRATE 10 MG PO TABS
5.0000 mg | ORAL_TABLET | Freq: Every evening | ORAL | 1 refills | Status: DC | PRN
Start: 2022-10-27 — End: 2023-02-22

## 2022-10-27 NOTE — Telephone Encounter (Signed)
ERx 

## 2022-10-27 NOTE — Telephone Encounter (Signed)
LAST APPOINTMENT DATE: 10/04/22   NEXT APPOINTMENT DATE: 01/08/2023   Zolpidem 10mg   LAST REFILL: 07/04/22  QTY: #30 1RF

## 2022-10-27 NOTE — Telephone Encounter (Signed)
Prescription Request  10/27/2022  LOV: 10/04/2022  What is the name of the medication or equipment? zolpidem    Have you contacted your pharmacy to request a refill? No   Which pharmacy would you like this sent to?   [id number], [place] - [address] [place] [address] Phone: [phone] Fax: [phone]    Patient notified that their request is being sent to the clinical staff for review and that they should receive a response within 2 business days.   Please advise at Mobile There is no such number on file (mobile).

## 2022-11-06 ENCOUNTER — Other Ambulatory Visit: Payer: Self-pay | Admitting: Family Medicine

## 2022-11-06 ENCOUNTER — Ambulatory Visit
Admission: RE | Admit: 2022-11-06 | Discharge: 2022-11-06 | Disposition: A | Discharge status: Home / Self Care | Payer: Federal, State, Local not specified - PPO | Source: Ambulatory Visit | Attending: Family Medicine | Admitting: Family Medicine

## 2022-11-06 DIAGNOSIS — E785 Hyperlipidemia, unspecified: Secondary | ICD-10-CM | POA: Insufficient documentation

## 2022-11-06 DIAGNOSIS — R931 Abnormal findings on diagnostic imaging of heart and coronary circulation: Secondary | ICD-10-CM | POA: Insufficient documentation

## 2022-11-06 DIAGNOSIS — Z8249 Family history of ischemic heart disease and other diseases of the circulatory system: Secondary | ICD-10-CM

## 2022-11-06 MED ORDER — ASPIRIN 81 MG PO TBEC: 81.0000 mg | DELAYED_RELEASE_TABLET | Freq: Every day | ORAL | Status: AC

## 2022-11-23 ENCOUNTER — Other Ambulatory Visit: Payer: Self-pay | Admitting: Family Medicine

## 2022-11-23 DIAGNOSIS — G894 Chronic pain syndrome: Secondary | ICD-10-CM

## 2022-12-06 ENCOUNTER — Other Ambulatory Visit: Payer: Self-pay | Admitting: Family Medicine

## 2022-12-06 DIAGNOSIS — E291 Testicular hypofunction: Secondary | ICD-10-CM

## 2022-12-10 NOTE — Telephone Encounter (Signed)
ERx Plz schedule 8am testosterone lab test 1 wk after next injection.

## 2022-12-11 NOTE — Telephone Encounter (Signed)
Patient has been scheduled

## 2022-12-11 NOTE — Telephone Encounter (Signed)
Noted  

## 2022-12-14 DIAGNOSIS — F1120 Opioid dependence, uncomplicated: Secondary | ICD-10-CM | POA: Diagnosis not present

## 2022-12-14 DIAGNOSIS — Z5181 Encounter for therapeutic drug level monitoring: Secondary | ICD-10-CM | POA: Diagnosis not present

## 2022-12-20 ENCOUNTER — Other Ambulatory Visit (INDEPENDENT_AMBULATORY_CARE_PROVIDER_SITE_OTHER): Payer: Federal, State, Local not specified - PPO

## 2022-12-20 DIAGNOSIS — E291 Testicular hypofunction: Secondary | ICD-10-CM | POA: Diagnosis not present

## 2022-12-20 LAB — TESTOSTERONE: Testosterone: 463.57 ng/dL (ref 300.00–890.00)

## 2022-12-26 ENCOUNTER — Telehealth: Payer: Self-pay

## 2022-12-26 ENCOUNTER — Other Ambulatory Visit (HOSPITAL_COMMUNITY): Payer: Self-pay

## 2022-12-26 NOTE — Telephone Encounter (Signed)
Pharmacy Patient Advocate Encounter   Received notification from CoverMyMeds that prior authorization for Zolpidem is required/requested.   Insurance verification completed.   The patient is insured through [facility] .   Per test claim: PA required; PA submitted to above mentioned insurance via CoverMyMeds Key/confirmation #/EOC Key: [id number]  Status is pending

## 2022-12-27 ENCOUNTER — Other Ambulatory Visit (HOSPITAL_COMMUNITY): Payer: Self-pay

## 2022-12-27 NOTE — Telephone Encounter (Signed)
Pharmacy Patient Advocate Encounter  Received notification from [facility] that Prior Authorization for Zolpidem has been APPROVED from 11.19.24 to 11.19.25. Ran test claim, and the Rx has been filled and is payable again on/after 12.12.24. This test claim was processed through [facility]- copay amounts may vary at other pharmacies due to pharmacy/plan contracts, or as the patient moves through the different stages of their insurance plan.   PA #/Case ID/Reference #: Key: Malachi Bonds

## 2022-12-28 ENCOUNTER — Ambulatory Visit: Payer: Federal, State, Local not specified - PPO | Admitting: Cardiology

## 2022-12-31 ENCOUNTER — Other Ambulatory Visit: Payer: Self-pay | Admitting: Family Medicine

## 2022-12-31 DIAGNOSIS — E291 Testicular hypofunction: Secondary | ICD-10-CM

## 2022-12-31 DIAGNOSIS — E785 Hyperlipidemia, unspecified: Secondary | ICD-10-CM

## 2022-12-31 DIAGNOSIS — Z125 Encounter for screening for malignant neoplasm of prostate: Secondary | ICD-10-CM

## 2022-12-31 DIAGNOSIS — R7303 Prediabetes: Secondary | ICD-10-CM

## 2022-12-31 DIAGNOSIS — E538 Deficiency of other specified B group vitamins: Secondary | ICD-10-CM

## 2023-01-01 ENCOUNTER — Other Ambulatory Visit (INDEPENDENT_AMBULATORY_CARE_PROVIDER_SITE_OTHER): Payer: Federal, State, Local not specified - PPO

## 2023-01-01 DIAGNOSIS — E538 Deficiency of other specified B group vitamins: Secondary | ICD-10-CM | POA: Diagnosis not present

## 2023-01-01 DIAGNOSIS — R7303 Prediabetes: Secondary | ICD-10-CM | POA: Diagnosis not present

## 2023-01-01 DIAGNOSIS — Z125 Encounter for screening for malignant neoplasm of prostate: Secondary | ICD-10-CM | POA: Diagnosis not present

## 2023-01-01 DIAGNOSIS — E785 Hyperlipidemia, unspecified: Secondary | ICD-10-CM

## 2023-01-01 DIAGNOSIS — E291 Testicular hypofunction: Secondary | ICD-10-CM

## 2023-01-01 LAB — COMPREHENSIVE METABOLIC PANEL
ALT: 21 U/L (ref 0–53)
AST: 23 U/L (ref 0–37)
Albumin: 4.7 g/dL (ref 3.5–5.2)
Alkaline Phosphatase: 47 U/L (ref 39–117)
BUN: 20 mg/dL (ref 6–23)
CO2: 28 meq/L (ref 19–32)
Calcium: 9.7 mg/dL (ref 8.4–10.5)
Chloride: 104 meq/L (ref 96–112)
Creatinine, Ser: 1.37 mg/dL (ref 0.40–1.50)
GFR: 58.65 mL/min — ABNORMAL LOW (ref >60.00)
Glucose, Bld: 112 mg/dL — ABNORMAL HIGH (ref 70–99)
Potassium: 4.4 meq/L (ref 3.5–5.1)
Sodium: 139 meq/L (ref 135–145)
Total Bilirubin: 0.5 mg/dL (ref 0.2–1.2)
Total Protein: 7.1 g/dL (ref 6.0–8.3)

## 2023-01-01 LAB — CBC WITH DIFFERENTIAL/PLATELET
Basophils Absolute: 0 10*3/uL (ref 0.0–0.1)
Basophils Relative: 0.7 % (ref 0.0–3.0)
Eosinophils Absolute: 0.5 10*3/uL (ref 0.0–0.7)
Eosinophils Relative: 8.1 % — ABNORMAL HIGH (ref 0.0–5.0)
HCT: 41.9 % (ref 39.0–52.0)
Hemoglobin: 14 g/dL (ref 13.0–17.0)
Lymphocytes Relative: 25.6 % (ref 12.0–46.0)
Lymphs Abs: 1.6 10*3/uL (ref 0.7–4.0)
MCHC: 33.4 g/dL (ref 30.0–36.0)
MCV: 87.5 fL (ref 78.0–100.0)
Monocytes Absolute: 0.5 10*3/uL (ref 0.1–1.0)
Monocytes Relative: 8.5 % (ref 3.0–12.0)
Neutro Abs: 3.6 10*3/uL (ref 1.4–7.7)
Neutrophils Relative %: 57.1 % (ref 43.0–77.0)
Platelets: 357 10*3/uL (ref 150.0–400.0)
RBC: 4.79 Mil/uL (ref 4.22–5.81)
RDW: 13.1 % (ref 11.5–15.5)
WBC: 6.2 10*3/uL (ref 4.0–10.5)

## 2023-01-01 LAB — LIPID PANEL
Cholesterol: 178 mg/dL (ref 0–200)
HDL: 46.1 mg/dL (ref >39.00)
LDL Cholesterol: 100 mg/dL — ABNORMAL HIGH (ref 0–99)
NonHDL: 131.61
Total CHOL/HDL Ratio: 4
Triglycerides: 160 mg/dL — ABNORMAL HIGH (ref 0.0–149.0)
VLDL: 32 mg/dL (ref 0.0–40.0)

## 2023-01-01 LAB — PSA: PSA: 1.25 ng/mL (ref 0.10–4.00)

## 2023-01-01 LAB — HEMOGLOBIN A1C: Hgb A1c MFr Bld: 5.7 % (ref 4.6–6.5)

## 2023-01-01 LAB — VITAMIN B12: Vitamin B-12: 798 pg/mL (ref 211–911)

## 2023-01-06 ENCOUNTER — Other Ambulatory Visit: Payer: Self-pay | Admitting: Family Medicine

## 2023-01-06 DIAGNOSIS — E785 Hyperlipidemia, unspecified: Secondary | ICD-10-CM

## 2023-01-06 DIAGNOSIS — I10 Essential (primary) hypertension: Secondary | ICD-10-CM

## 2023-01-08 ENCOUNTER — Encounter: Payer: Federal, State, Local not specified - PPO | Admitting: Family Medicine

## 2023-01-09 ENCOUNTER — Encounter: Payer: Self-pay | Admitting: Family Medicine

## 2023-01-09 NOTE — Telephone Encounter (Signed)
ERx 1 90d supply Pt missed CPE yesterday - please call and reschedule

## 2023-01-09 NOTE — Telephone Encounter (Signed)
Lvm for patient tcb and schedule 

## 2023-01-16 ENCOUNTER — Encounter: Payer: Self-pay | Admitting: Family Medicine

## 2023-01-16 ENCOUNTER — Ambulatory Visit (INDEPENDENT_AMBULATORY_CARE_PROVIDER_SITE_OTHER): Payer: Federal, State, Local not specified - PPO | Admitting: Family Medicine

## 2023-01-16 VITALS — BP 132/78 | HR 68 | Temp 98.2°F | Ht 72.75 in | Wt 234.0 lb

## 2023-01-16 DIAGNOSIS — F5104 Psychophysiologic insomnia: Secondary | ICD-10-CM

## 2023-01-16 DIAGNOSIS — R931 Abnormal findings on diagnostic imaging of heart and coronary circulation: Secondary | ICD-10-CM

## 2023-01-16 DIAGNOSIS — E291 Testicular hypofunction: Secondary | ICD-10-CM

## 2023-01-16 DIAGNOSIS — Z8249 Family history of ischemic heart disease and other diseases of the circulatory system: Secondary | ICD-10-CM

## 2023-01-16 DIAGNOSIS — R7303 Prediabetes: Secondary | ICD-10-CM

## 2023-01-16 DIAGNOSIS — G894 Chronic pain syndrome: Secondary | ICD-10-CM

## 2023-01-16 DIAGNOSIS — E538 Deficiency of other specified B group vitamins: Secondary | ICD-10-CM

## 2023-01-16 DIAGNOSIS — Z0000 Encounter for general adult medical examination without abnormal findings: Secondary | ICD-10-CM

## 2023-01-16 DIAGNOSIS — E66811 Obesity, class 1: Secondary | ICD-10-CM

## 2023-01-16 DIAGNOSIS — E785 Hyperlipidemia, unspecified: Secondary | ICD-10-CM

## 2023-01-16 DIAGNOSIS — I10 Essential (primary) hypertension: Secondary | ICD-10-CM | POA: Diagnosis not present

## 2023-01-16 MED ORDER — AMLODIPINE BESYLATE 10 MG PO TABS: 10.0000 mg | ORAL_TABLET | Freq: Every day | ORAL | 4 refills | Status: DC

## 2023-01-16 MED ORDER — FENOFIBRATE 145 MG PO TABS: 145.0000 mg | ORAL_TABLET | Freq: Every day | ORAL | 4 refills | Status: DC

## 2023-01-16 MED ORDER — VITAMIN B-12 1000 MCG PO TABS: 1000.0000 ug | ORAL_TABLET | Freq: Every day | ORAL | Status: DC

## 2023-01-16 MED ORDER — BENAZEPRIL HCL 10 MG PO TABS: 10.0000 mg | ORAL_TABLET | Freq: Every day | ORAL | 4 refills | Status: DC

## 2023-01-16 MED ORDER — ATORVASTATIN CALCIUM 40 MG PO TABS: 40.0000 mg | ORAL_TABLET | Freq: Every day | ORAL | 4 refills | Status: DC

## 2023-01-16 NOTE — Assessment & Plan Note (Signed)
Continues ambien 5mg  nightly. Tolerating well without side effects.

## 2023-01-16 NOTE — Assessment & Plan Note (Signed)
Pending cardiology eval.

## 2023-01-16 NOTE — Assessment & Plan Note (Signed)
Lp(a) <10 (12/2021) CAC 513 (10/2022) - referred to cardiology

## 2023-01-16 NOTE — Assessment & Plan Note (Signed)
Chronic, stable on current regimen - continue. 

## 2023-01-16 NOTE — Assessment & Plan Note (Signed)
Followed by pain clinic on buprenorphine/naloxone

## 2023-01-16 NOTE — Assessment & Plan Note (Signed)
Stable period - continue to avoid added sugar in diet.

## 2023-01-16 NOTE — Assessment & Plan Note (Signed)
Encourage healthy diet and lifestyle choices to affect sustainable weight loss.  

## 2023-01-16 NOTE — Assessment & Plan Note (Signed)
Doing better on daily b12 replacement.

## 2023-01-16 NOTE — Assessment & Plan Note (Signed)
Stable period on testosterone IM replacement 200mg  Q2 wks.

## 2023-01-16 NOTE — Assessment & Plan Note (Signed)
Preventative protocols reviewed and updated unless pt declined. Discussed healthy diet and lifestyle.  

## 2023-01-16 NOTE — Progress Notes (Signed)
Ph: [phone] Fax: [phone]   Patient ID: William Friedman, male    DOB: [birth year], 52 y.o.   MRN: [id number]  This visit was conducted in person.  BP 132/78   Pulse 68   Temp 98.2 F (36.8 C) (Temporal)   Ht 6' 0.75" (1.848 m)   Wt 234 lb (106.1 kg)   SpO2 97%   BMI 31.09 kg/m    CC: CPE Subjective:   HPI: William Friedman is a 52 y.o. male presenting on 01/16/2023 for Annual Exam   Trazodone ineffective for insomnia - continues ambien 10mg  for sleep, 1/2 tab at a time. No parasomnias.    Chronic pain followed by [facility] psych counseling pain clinic on suboxone   Hypogonadism - IM testosterone cypionate 200mg  Q2 wk replacement started late 09/2022.   Fmhx premature CAD. Lp(a) <10 (12/2021). Coronary calcium score 514 - pending cardiology eval 02/2023.    Preventative: Colonoscopy 04/2022 - WNL Tomasa Rand)  Prostate cancer screening - continues yearly PSA.  Lung cancer screening - not eligible  Flu shot - yearly COVID vaccine Pfizer 03/2019 x2, no booster.  Tetanus ~2012  Shingrix - 12/2021, 09/2022 Seat belt use discussed  Sunscreen use discussed. No changing moles on skin.  Sleep - averaging 6-7 hours/night  Non smoker  Alcohol - 3-4 mixed drinks/wk  Dentist - yearly  Eye exam - yearly, s/p lasik 2006   Lives with wife and daughter, 1 dog  Occupation: Psychologist, sport and exercise - [employer]  Edu: BS  Activity: golfing, walking 9 holes 3-4 times a week, band resistance training at home  Diet: good water, backed off soda, fish and fruits/vegetables regularly      Relevant past medical, surgical, family and social history reviewed and updated as indicated. Interim medical history since our last visit reviewed. Allergies and medications reviewed and updated. Outpatient Medications Prior to Visit  Medication Sig Dispense Refill   aspirin EC 81 MG tablet Take 1 tablet (81 mg total) by mouth daily. Swallow whole.     Buprenorphine HCl-Naloxone HCl 2-0.5 MG FILM  Place 1 tablet under the tongue every other day.  0   methocarbamol (ROBAXIN) 750 MG tablet TAKE 1 TABLET BY MOUTH THREE TIMES A DAY AS NEEDED FOR MUSCLE SPASM 60 tablet 3   Multiple Vitamins-Minerals (MULTIVITAMIN PO) Take 1 tablet by mouth daily.     testosterone cypionate (DEPOTESTOTERONE CYPIONATE) 100 MG/ML injection INJECT 2 MLS (200 MG TOTAL) INTO THE MUSCLE EVERY 14 (FOURTEEN) DAYS. FOR IM USE ONLY 10 mL 0   zolpidem (AMBIEN) 10 MG tablet Take 0.5-1 tablets (5-10 mg total) by mouth at bedtime as needed. for sleep 30 tablet 1   amLODipine (NORVASC) 10 MG tablet TAKE 1 TABLET BY MOUTH EVERY DAY 90 tablet 0   atorvastatin (LIPITOR) 40 MG tablet TAKE 1 TABLET BY MOUTH EVERY DAY 90 tablet 0   benazepril (LOTENSIN) 10 MG tablet TAKE 1 TABLET BY MOUTH EVERY DAY 90 tablet 3   fenofibrate (TRICOR) 145 MG tablet TAKE 1/2 TABLETS (72.5 MG TOTAL) BY MOUTH DAILY. 45 tablet 0   No facility-administered medications prior to visit.     Per HPI unless specifically indicated in ROS section below Review of Systems  Constitutional:  Negative for activity change, appetite change, chills, fatigue, fever and unexpected weight change.  HENT:  Negative for hearing loss.   Eyes:  Negative for visual disturbance.  Respiratory:  Negative for cough, chest tightness, shortness of breath and wheezing.   Cardiovascular:  Negative for chest pain, palpitations and leg swelling.  Gastrointestinal:  Negative for abdominal distention, abdominal pain, blood in stool, constipation, diarrhea, nausea and vomiting.  Genitourinary:  Negative for difficulty urinating and hematuria.  Musculoskeletal:  Negative for arthralgias, myalgias and neck pain.  Skin:  Negative for rash.  Neurological:  Negative for dizziness, seizures, syncope and headaches.  Hematological:  Negative for adenopathy. Does not bruise/bleed easily.  Psychiatric/Behavioral:  Negative for dysphoric mood. The patient is not nervous/anxious.     Objective:   BP 132/78   Pulse 68   Temp 98.2 F (36.8 C) (Temporal)   Ht 6' 0.75" (1.848 m)   Wt 234 lb (106.1 kg)   SpO2 97%   BMI 31.09 kg/m   Wt Readings from Last 3 Encounters:  01/16/23 234 lb (106.1 kg)  10/04/22 231 lb 12.8 oz (105.1 kg)  09/27/22 225 lb (102.1 kg)      Physical Exam Vitals and nursing note reviewed.  Constitutional:      General: He is not in acute distress.    Appearance: Normal appearance. He is well-developed. He is not ill-appearing.  HENT:     Head: Normocephalic and atraumatic.     Right Ear: Hearing, tympanic membrane, ear canal and external ear normal.     Left Ear: Hearing, tympanic membrane, ear canal and external ear normal.     Nose: Nose normal.     Mouth/Throat:     Mouth: Mucous membranes are moist.     Pharynx: Oropharynx is clear. No oropharyngeal exudate or posterior oropharyngeal erythema.  Eyes:     General: No scleral icterus.    Extraocular Movements: Extraocular movements intact.     Conjunctiva/sclera: Conjunctivae normal.     Pupils: Pupils are equal, round, and reactive to light.  Neck:     Thyroid: No thyroid mass or thyromegaly.  Cardiovascular:     Rate and Rhythm: Normal rate and regular rhythm.     Pulses: Normal pulses.          Radial pulses are 2+ on the right side and 2+ on the left side.     Heart sounds: Normal heart sounds. No murmur heard. Pulmonary:     Effort: Pulmonary effort is normal. No respiratory distress.     Breath sounds: Normal breath sounds. No wheezing, rhonchi or rales.  Abdominal:     General: Bowel sounds are normal. There is no distension.     Palpations: Abdomen is soft. There is no mass.     Tenderness: There is no abdominal tenderness. There is no guarding or rebound.     Hernia: No hernia is present.  Musculoskeletal:        General: Normal range of motion.     Cervical back: Normal range of motion and neck supple.     Right lower leg: No edema.     Left lower leg: No edema.   Lymphadenopathy:     Cervical: No cervical adenopathy.  Skin:    General: Skin is warm and dry.     Findings: No rash.  Neurological:     General: No focal deficit present.     Mental Status: He is alert and oriented to person, place, and time.  Psychiatric:        Mood and Affect: Mood normal.        Behavior: Behavior normal.        Thought Content: Thought content normal.        Judgment: Judgment  normal.       Results for orders placed or performed in visit on 01/01/23  Vitamin B12  Result Value Ref Range   Vitamin B-12 798 211 - 911 pg/mL  CBC with Differential/Platelet  Result Value Ref Range   WBC 6.2 4.0 - 10.5 K/uL   RBC 4.79 4.22 - 5.81 Mil/uL   Hemoglobin 14.0 13.0 - 17.0 g/dL   HCT 19.1 47.8 - 29.5 %   MCV 87.5 78.0 - 100.0 fl   MCHC 33.4 30.0 - 36.0 g/dL   RDW 62.1 30.8 - 65.7 %   Platelets 357.0 150.0 - 400.0 K/uL   Neutrophils Relative % 57.1 43.0 - 77.0 %   Lymphocytes Relative 25.6 12.0 - 46.0 %   Monocytes Relative 8.5 3.0 - 12.0 %   Eosinophils Relative 8.1 (H) 0.0 - 5.0 %   Basophils Relative 0.7 0.0 - 3.0 %   Neutro Abs 3.6 1.4 - 7.7 K/uL   Lymphs Abs 1.6 0.7 - 4.0 K/uL   Monocytes Absolute 0.5 0.1 - 1.0 K/uL   Eosinophils Absolute 0.5 0.0 - 0.7 K/uL   Basophils Absolute 0.0 0.0 - 0.1 K/uL  PSA  Result Value Ref Range   PSA 1.25 0.10 - 4.00 ng/mL  Hemoglobin A1c  Result Value Ref Range   Hgb A1c MFr Bld 5.7 4.6 - 6.5 %  Comprehensive metabolic panel  Result Value Ref Range   Sodium 139 135 - 145 mEq/L   Potassium 4.4 3.5 - 5.1 mEq/L   Chloride 104 96 - 112 mEq/L   CO2 28 19 - 32 mEq/L   Glucose, Bld 112 (H) 70 - 99 mg/dL   BUN 20 6 - 23 mg/dL   Creatinine, Ser 8.46 0.40 - 1.50 mg/dL   Total Bilirubin 0.5 0.2 - 1.2 mg/dL   Alkaline Phosphatase 47 39 - 117 U/L   AST 23 0 - 37 U/L   ALT 21 0 - 53 U/L   Total Protein 7.1 6.0 - 8.3 g/dL   Albumin 4.7 3.5 - 5.2 g/dL   GFR 96.29 (L) >52.84 mL/min   Calcium 9.7 8.4 - 10.5 mg/dL  Lipid  panel  Result Value Ref Range   Cholesterol 178 0 - 200 mg/dL   Triglycerides 132.4 (H) 0.0 - 149.0 mg/dL   HDL 40.10 >27.25 mg/dL   VLDL 36.6 0.0 - 44.0 mg/dL   LDL Cholesterol 347 (H) 0 - 99 mg/dL   Total CHOL/HDL Ratio 4    NonHDL 131.61     Assessment & Plan:   Problem List Items Addressed This Visit     Health maintenance examination - Primary (Chronic)    Preventative protocols reviewed and updated unless pt declined. Discussed healthy diet and lifestyle.       HTN (hypertension)    Chronic, stable on current regimen - continue.       Relevant Medications   amLODipine (NORVASC) 10 MG tablet   atorvastatin (LIPITOR) 40 MG tablet   benazepril (LOTENSIN) 10 MG tablet   fenofibrate (TRICOR) 145 MG tablet   Chronic pain syndrome    Followed by pain clinic on buprenorphine/naloxone       Obesity, Class I, BMI 30-34.9    Encourage healthy diet and lifestyle choices to affect sustainable weight loss.       Dyslipidemia    Chronic, continues fenofibrate and atorvastatin 40mg  daily. Recently referred to cardiology for elevated CAC. The 10-year ASCVD risk score (Arnett DK, et al., 2019) is: 5.9%  Values used to calculate the score:     Age: 71 years     Sex: Male     Is Non-Hispanic African American: No     Diabetic: No     Tobacco smoker: No     Systolic Blood Pressure: 132 mmHg     Is BP treated: Yes     HDL Cholesterol: 46.1 mg/dL     Total Cholesterol: 178 mg/dL       Relevant Medications   atorvastatin (LIPITOR) 40 MG tablet   fenofibrate (TRICOR) 145 MG tablet   Prediabetes    Stable period - continue to avoid added sugar in diet.       Secondary male hypogonadism    Stable period on testosterone IM replacement 200mg  Q2 wks.       Chronic insomnia    Continues ambien 5mg  nightly. Tolerating well without side effects.       Family history of early CAD    Lp(a) <10 (12/2021) CAC 513 (10/2022) - referred to cardiology       Agatston coronary  artery calcium score greater than 400    Pending cardiology eval.       Relevant Medications   amLODipine (NORVASC) 10 MG tablet   atorvastatin (LIPITOR) 40 MG tablet   benazepril (LOTENSIN) 10 MG tablet   fenofibrate (TRICOR) 145 MG tablet   Low serum vitamin B12    Doing better on daily b12 replacement.         Meds ordered this encounter  Medications   amLODipine (NORVASC) 10 MG tablet    Sig: Take 1 tablet (10 mg total) by mouth daily.    Dispense:  90 tablet    Refill:  4   atorvastatin (LIPITOR) 40 MG tablet    Sig: Take 1 tablet (40 mg total) by mouth daily.    Dispense:  90 tablet    Refill:  4   benazepril (LOTENSIN) 10 MG tablet    Sig: Take 1 tablet (10 mg total) by mouth daily.    Dispense:  90 tablet    Refill:  4   fenofibrate (TRICOR) 145 MG tablet    Sig: Take 1 tablet (145 mg total) by mouth daily.    Dispense:  45 tablet    Refill:  4   cyanocobalamin (VITAMIN B12) 1000 MCG tablet    Sig: Take 1 tablet (1,000 mcg total) by mouth daily.    No orders of the defined types were placed in this encounter.   Patient Instructions  Continue current medicines  We will await cardiology recommendations.  Good to see you today. You are doing well today.  Return as needed or in 1 year for next physical.   Follow up plan: Return in about 1 year (around 01/16/2024) for annual exam, prior fasting for blood work.  Eustaquio Boyden, MD

## 2023-01-16 NOTE — Assessment & Plan Note (Signed)
Chronic, continues fenofibrate and atorvastatin 40mg  daily. Recently referred to cardiology for elevated CAC. The 10-year ASCVD risk score (Arnett DK, et al., 2019) is: 5.9%   Values used to calculate the score:     Age: 52 years     Sex: Male     Is Non-Hispanic African American: No     Diabetic: No     Tobacco smoker: No     Systolic Blood Pressure: 132 mmHg     Is BP treated: Yes     HDL Cholesterol: 46.1 mg/dL     Total Cholesterol: 178 mg/dL

## 2023-01-16 NOTE — Patient Instructions (Addendum)
Continue current medicines  We will await cardiology recommendations.  Good to see you today. You are doing well today.  Return as needed or in 1 year for next physical.

## 2023-02-21 ENCOUNTER — Other Ambulatory Visit: Payer: Self-pay | Admitting: Family Medicine

## 2023-02-21 DIAGNOSIS — F5104 Psychophysiologic insomnia: Secondary | ICD-10-CM

## 2023-02-21 DIAGNOSIS — E291 Testicular hypofunction: Secondary | ICD-10-CM

## 2023-02-21 NOTE — Telephone Encounter (Signed)
 Name of Medication:  Testosterone  inj, Ambien  Name of Pharmacy:  [facility] Last Fill or Written Date and Quantity:        Testosterone  inj:  12/17/22, #10 mL      Ambien :  12/25/22, #30 Last Office Visit and Type:  01/16/23, CPE Next Office Visit and Type:  01/22/24, CPE Last Controlled Substance Agreement Date: none Last UDS: none

## 2023-02-22 NOTE — Telephone Encounter (Signed)
ERx 

## 2023-02-26 NOTE — Progress Notes (Signed)
CARDIOLOGY CONSULT NOTE       Patient ID: JAEKWON MCCLUNE MRN: [id number] DOB/AGE: 03/12/1970 53 y.o.  Referring Physician: Sharen Hones Primary Physician: Eustaquio Boyden, MD Primary Cardiologist: New Reason for Consultation: CAD/High calcium score    HPI:  53 y.o. referred by Dr Reece Agar for CAD, high calcium score and family history of CAD. He is overweight, with HTN and HLD. Most recent LDL 01/01/23 was 100. Looks like lipitor dose was increase to 40 mg Calcium score done 11/06/22 reviewed. Total 514 with predominance in LAD this was 97 th percentile for age/sex. BP is well controlled on norvasc and lotensin. His LPa was < 10 12/2021. Has been on testosterone replacement in past most recent level 12/20/22 was normal 463.   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 Does have liquor ( whiskey/fireball) 3-4 times per week and when golfing   Has rare atypical sharp chest pains when golfing a lot   ROS All other systems reviewed and negative except as noted above  Past Medical History:  Diagnosis Date   Chronic fatigue    Chronic pain    sees [facility])   DDD (degenerative disc disease), lumbar    chronic pain from this on chronic narcotics   Family history of early CAD    History of chicken pox    History of kidney stones    remote   History of migraine    HTN (hypertension) 02/07/2012   normal renal duplex   Hyperlipidemia    Hypogonadism male    Overweight(278.02)     Family History  Problem Relation Age of Onset   Hypertension Mother    Cancer Mother        breast   Hypertension Father    Diabetes Father    Stroke Father 53       ministroke   Coronary artery disease Father 31       CABG   Cancer Sister        breast   Heart disease Maternal Grandmother    Coronary  artery disease Maternal Grandfather 30   Cancer Cousin 11       Ewing sarcoma   Colon cancer Neg Hx    Colon polyps Neg Hx    Esophageal cancer Neg Hx    Rectal cancer Neg Hx    Stomach cancer Neg Hx     Social History   Socioeconomic History   Marital status: Married    Spouse name: Not on file   Number of children: Not on file   Years of education: Not on file   Highest education level: Bachelor's degree (e.g., BA, AB, BS)  Occupational History   Not on file  Tobacco Use   Smoking status: Never   Smokeless tobacco: Never  Substance and Sexual Activity   Alcohol use: No    Comment: rarely   Drug use: Yes    Types: Fentanyl    Comment: Opioid---Oxycodone, Hydrocodone   Sexual activity: Not on file  Other Topics Concern   Not on file  Social History Narrative   Caffeine: sodas Dr Reino Kent - 1L/day   Lives with wife and daughter, 1 dog   Occupation: special agent - [employer]   Edu: BS    Activity: golfing, likes sports.    Diet: poor -  not regular fruits/vegetables, not regular water    Social Drivers of Health   Financial Resource Strain: Low Risk  (01/15/2023)   Overall Financial Resource Strain (CARDIA)    Difficulty of Paying Living Expenses: Not hard at all  Food Insecurity: No Food Insecurity (01/15/2023)   Hunger Vital Sign    Worried About Running Out of Food in the Last Year: Never true    Ran Out of Food in the Last Year: Never true  Transportation Needs: No Transportation Needs (01/15/2023)   PRAPARE - Administrator, Civil Service (Medical): No    Lack of Transportation (Non-Medical): No  Physical Activity: Sufficiently Active (01/15/2023)   Exercise Vital Sign    Days of Exercise per Week: 5 days    Minutes of Exercise per Session: 120 min  Stress: No Stress Concern Present (01/15/2023)   [employer] of Occupational Health - Occupational Stress Questionnaire    Feeling of Stress : Not at all  Social Connections: Moderately Integrated  (01/15/2023)   Social Connection and Isolation Panel [NHANES]    Frequency of Communication with Friends and Family: Three times a week    Frequency of Social Gatherings with Friends and Family: Three times a week    Attends Religious Services: 1 to 4 times per year    Active Member of Clubs or Organizations: No    Attends Banker Meetings: Not on file    Marital Status: Married  Intimate Partner Violence: Not on file    Past Surgical History:  Procedure Laterality Date   COLONOSCOPY  04/2022   WNL Tomasa Rand)   ELBOW SURGERY  02/07/1988   R (was pitcher in college)   ELBOW SURGERY  02/06/2002   R   KNEE CARTILAGE SURGERY Left 2022   SHOULDER SURGERY  02/07/1991   R      Current Outpatient Medications:    amLODipine (NORVASC) 10 MG tablet, Take 1 tablet (10 mg total) by mouth daily., Disp: 90 tablet, Rfl: 4   aspirin EC 81 MG tablet, Take 1 tablet (81 mg total) by mouth daily. Swallow whole., Disp: , Rfl:    atorvastatin (LIPITOR) 40 MG tablet, Take 1 tablet (40 mg total) by mouth daily., Disp: 90 tablet, Rfl: 4   benazepril (LOTENSIN) 10 MG tablet, Take 1 tablet (10 mg total) by mouth daily., Disp: 90 tablet, Rfl: 4   Buprenorphine HCl-Naloxone HCl 2-0.5 MG FILM, Place 1 tablet under the tongue every other day., Disp: , Rfl: 0   cyanocobalamin (VITAMIN B12) 1000 MCG tablet, Take 1 tablet (1,000 mcg total) by mouth daily., Disp: , Rfl:    fenofibrate (TRICOR) 145 MG tablet, Take 1 tablet (145 mg total) by mouth daily., Disp: 45 tablet, Rfl: 4   methocarbamol (ROBAXIN) 750 MG tablet, TAKE 1 TABLET BY MOUTH THREE TIMES A DAY AS NEEDED FOR MUSCLE SPASM, Disp: 60 tablet, Rfl: 3   Multiple Vitamins-Minerals (MULTIVITAMIN PO), Take 1 tablet by mouth daily., Disp: , Rfl:    testosterone cypionate (DEPOTESTOTERONE CYPIONATE) 100 MG/ML injection, INJECT 2 MLS (200 MG TOTAL) INTO THE MUSCLE EVERY 14 (FOURTEEN) DAYS. FOR IM USE ONLY, Disp: 10 mL, Rfl: 0   zolpidem (AMBIEN) 10 MG  tablet, TAKE 0.5-1 TABLETS (5-10 MG TOTAL) BY MOUTH AT BEDTIME AS NEEDED. FOR SLEEP, Disp: 30 tablet, Rfl: 1    Physical Exam: Blood pressure (!) 160/98, pulse 78, height 6' (1.829 m), weight 239 lb (108.4 kg), SpO2 95%.    Affect appropriate Overweight  HEENT: normal Neck supple with no adenopathy JVP normal no bruits no thyromegaly Lungs clear with no wheezing and good diaphragmatic motion Heart:  S1/S2 no murmur, no rub, gallop or click PMI normal Abdomen: benighn, BS positve, no tenderness, no AAA no bruit.  No HSM or HJR Distal pulses intact with no bruits No edema Neuro non-focal Skin warm and dry No muscular weakness   Labs:   Lab Results  Component Value Date   WBC 6.2 01/01/2023   HGB 14.0 01/01/2023   HCT 41.9 01/01/2023   MCV 87.5 01/01/2023   PLT 357.0 01/01/2023   No results for input(s): "NA", "K", "CL", "CO2", "BUN", "CREATININE", "CALCIUM", "PROT", "BILITOT", "ALKPHOS", "ALT", "AST", "GLUCOSE" in the last 168 hours.  Invalid input(s): "LABALBU" No results found for: "CKTOTAL", "CKMB", "CKMBINDEX", "TROPONINI"  Lab Results  Component Value Date   CHOL 178 01/01/2023   CHOL 171 12/27/2021   CHOL 196 01/03/2021   Lab Results  Component Value Date   HDL 46.10 01/01/2023   HDL 50.90 12/27/2021   HDL 58.00 01/03/2021   Lab Results  Component Value Date   LDLCALC 100 (H) 01/01/2023   LDLCALC 84 12/27/2021   LDLCALC 116 (H) 01/03/2021   Lab Results  Component Value Date   TRIG 160.0 (H) 01/01/2023   TRIG 181.0 (H) 12/27/2021   TRIG 110.0 01/03/2021   Lab Results  Component Value Date   CHOLHDL 4 01/01/2023   CHOLHDL 3 12/27/2021   CHOLHDL 3 01/03/2021   Lab Results  Component Value Date   LDLDIRECT 144.0 12/15/2015   LDLDIRECT 99.0 06/03/2015   LDLDIRECT 126.0 05/11/2014      Radiology: No results found.  EKG: NSR normal 03/09/2023    ASSESSMENT AND PLAN:   CAD: subclinical high score for age > 400 primarily in LAD 25 th  percentile Shared decision making favor PET/CT to further risk stratify. Continue statin and ASA. Surprisingly LPa is low when checked 12/2021  HTN:  May need to increase lotensin He will monitor at home Discussed issues with BP control and alcohol 2 drug Rx DASH diet Weight loss Low T: with family history of CAD and high calcium score would not recommend replacement with normal Testosterone levels Chronic Pain:  on robaxin and buprenorphine/HCL-Naloxone F/U pain clinic  PET/CT  F/U in a year if low risk   Signed: Charlton Haws 03/09/2023, 10:55 AM

## 2023-03-09 ENCOUNTER — Ambulatory Visit: Payer: Federal, State, Local not specified - PPO | Attending: Cardiovascular Disease | Admitting: Cardiovascular Disease

## 2023-03-09 ENCOUNTER — Encounter: Payer: Self-pay | Admitting: Cardiovascular Disease

## 2023-03-09 VITALS — BP 160/98 | HR 78 | Ht 72.0 in | Wt 239.0 lb

## 2023-03-09 DIAGNOSIS — R072 Precordial pain: Secondary | ICD-10-CM | POA: Diagnosis not present

## 2023-03-09 DIAGNOSIS — I10 Essential (primary) hypertension: Secondary | ICD-10-CM

## 2023-03-09 DIAGNOSIS — I25118 Atherosclerotic heart disease of native coronary artery with other forms of angina pectoris: Secondary | ICD-10-CM

## 2023-03-09 NOTE — Patient Instructions (Addendum)
Medication Instructions:  Your physician recommends that you continue on your current medications as directed. Please refer to the Current Medication list given to you today.  *If you need a refill on your cardiac medications before your next appointment, please call your pharmacy*  Lab Work: If you have labs (blood work) drawn today and your tests are completely normal, you will receive your results only by: MyChart Message (if you have MyChart) OR A paper copy in the mail If you have any lab test that is abnormal or we need to change your treatment, we will call you to review the results.  Testing/Procedures: Cardiac PET CT scanning, (CAT scanning), is a noninvasive, special x-ray that produces cross-sectional images of the body using x-rays and a computer. CT scans help physicians diagnose and treat medical conditions. For some CT exams, a contrast material is used to enhance visibility in the area of the body being studied. CT scans provide greater clarity and reveal more details than regular x-ray exams.  Follow-Up: At [facility], you and your health needs are our priority.  As part of our continuing mission to provide you with exceptional heart care, we have created designated Provider Care Teams.  These Care Teams include your primary Cardiologist (physician) and Advanced Practice Providers (APPs -  Physician Assistants and Nurse Practitioners) who all work together to provide you with the care you need, when you need it.  We recommend signing up for the patient portal called "MyChart".  Sign up information is provided on this After Visit Summary.  MyChart is used to connect with patients for Virtual Visits (Telemedicine).  Patients are able to view lab/test results, encounter notes, upcoming appointments, etc.  Non-urgent messages can be sent to your provider as well.   To learn more about what you can do with MyChart, go to [email or web address].    Your next appointment:    1 year(s)  Provider:   Charlton Haws, MD     Other Instructions     Please report to Radiology at the [facility] Main Entrance 30 minutes early for your test.  [address] [place], [place] [address]   How to Prepare for Your Cardiac PET/CT Stress Test:  Nothing to eat or drink, except water, 3 hours prior to arrival time.  NO caffeine/decaffeinated products, or chocolate 12 hours prior to arrival. (Please note decaffeinated beverages (teas/coffees) still contain caffeine).  If you have caffeine within 12 hours prior, the test will need to be rescheduled.  Medication instructions: Do not take erectile dysfunction medications for 72 hours prior to test (sildenafil, tadalafil) Do not take nitrates (isosorbide mononitrate, Ranexa) the day before or day of test Do not take tamsulosin the day before or morning of test Hold theophylline containing medications for 12 hours. Hold Dipyridamole 48 hours prior to the test.  You may take your remaining medications with water.  NO  cologne or lotion on chest or abdomen area.   Total time is 1 to 2 hours; you may want to bring reading material for the waiting time.  In preparation for your appointment, medication and supplies will be purchased.  Appointment availability is limited, so if you need to cancel or reschedule, please call the Radiology Department Scheduler at [phone] 24 hours in advance to avoid a cancellation fee of $100.00  What to Expect When you Arrive:  Once you arrive and check in for your appointment, you will be taken to a preparation room within the Radiology  Department.  A technologist or Nurse will obtain your medical history, verify that you are correctly prepped for the exam, and explain the procedure.  Afterwards, an IV will be started in your arm and electrodes will be placed on your skin for EKG monitoring during the stress portion of the exam. Then you will be escorted to the PET/CT scanner.   There, staff will get you positioned on the scanner and obtain a blood pressure and EKG.  During the exam, you will continue to be connected to the EKG and blood pressure machines.  A small, safe amount of a radioactive tracer will be injected in your IV to obtain a series of pictures of your heart along with an injection of a stress agent.    After your Exam:  It is recommended that you eat a meal and drink a caffeinated beverage to counter act any effects of the stress agent.  Drink plenty of fluids for the remainder of the day and urinate frequently for the first couple of hours after the exam.  Your doctor will inform you of your test results within 7-10 business days.  For more information and frequently asked questions, please visit our website: [email or web address]  For questions about your test or how to prepare for your test, please call: Cardiac Imaging Nurse Navigators Office: [phone]

## 2023-03-19 DIAGNOSIS — F1120 Opioid dependence, uncomplicated: Secondary | ICD-10-CM | POA: Diagnosis not present

## 2023-03-19 DIAGNOSIS — Z5181 Encounter for therapeutic drug level monitoring: Secondary | ICD-10-CM | POA: Diagnosis not present

## 2023-03-23 ENCOUNTER — Other Ambulatory Visit: Payer: Self-pay | Admitting: Family Medicine

## 2023-03-23 DIAGNOSIS — G894 Chronic pain syndrome: Secondary | ICD-10-CM

## 2023-03-23 NOTE — Telephone Encounter (Signed)
Robaxin Last filled:  02/21/23, #60 Last filled:  01/16/23, CPE Next OV:  01/22/24, CPE

## 2023-03-27 ENCOUNTER — Telehealth: Payer: Self-pay | Admitting: Pharmacy Technician

## 2023-03-27 ENCOUNTER — Other Ambulatory Visit (HOSPITAL_COMMUNITY): Payer: Self-pay

## 2023-03-27 NOTE — Telephone Encounter (Signed)
 Pharmacy Patient Advocate Encounter  Received notification from [facility] that Prior Authorization for Testosterone Cypionate 100MG /ML intramuscular solution has been APPROVED from 04/06/2023 to 04/05/2024. Unable to obtain price due to refill too soon rejection, last fill date 02/23/2023 next available fill date 05/01/2023   PA #/Case ID/Reference #: [id number]

## 2023-04-09 ENCOUNTER — Other Ambulatory Visit: Payer: Self-pay | Admitting: Family Medicine

## 2023-04-09 DIAGNOSIS — E785 Hyperlipidemia, unspecified: Secondary | ICD-10-CM

## 2023-04-12 ENCOUNTER — Other Ambulatory Visit (HOSPITAL_COMMUNITY): Payer: Self-pay | Admitting: *Deleted

## 2023-04-12 DIAGNOSIS — R079 Chest pain, unspecified: Secondary | ICD-10-CM

## 2023-04-13 ENCOUNTER — Encounter (HOSPITAL_COMMUNITY): Payer: Self-pay

## 2023-04-17 ENCOUNTER — Encounter (HOSPITAL_COMMUNITY)
Admission: RE | Admit: 2023-04-17 | Discharge: 2023-04-17 | Disposition: A | Discharge status: Home / Self Care | Payer: Federal, State, Local not specified - PPO | Source: Ambulatory Visit | Attending: Cardiovascular Disease | Admitting: Cardiovascular Disease

## 2023-04-17 DIAGNOSIS — I25118 Atherosclerotic heart disease of native coronary artery with other forms of angina pectoris: Secondary | ICD-10-CM | POA: Diagnosis not present

## 2023-04-17 DIAGNOSIS — R072 Precordial pain: Secondary | ICD-10-CM | POA: Diagnosis not present

## 2023-04-17 DIAGNOSIS — I10 Essential (primary) hypertension: Secondary | ICD-10-CM | POA: Diagnosis not present

## 2023-04-17 LAB — NM PET CT CARDIAC PERFUSION MULTI W/ABSOLUTE BLOODFLOW
LV dias vol: 143 mL (ref 62–150)
LV sys vol: 73 mL
MBFR: 2.1
Nuc Rest EF: 48 %
Nuc Stress EF: 57 %
Rest MBF: 0.79 ml/g/min
Rest Nuclear Isotope Dose: 28.3 mCi
ST Depression (mm): 0 mm
Stress MBF: 1.66 ml/g/min
Stress Nuclear Isotope Dose: 28.3 mCi

## 2023-04-17 MED ORDER — RUBIDIUM RB82 GENERATOR (RUBYFILL)
28.0100 | PACK | Freq: Once | INTRAVENOUS | Status: AC
  Administered 2023-04-17: 28.01 via INTRAVENOUS

## 2023-04-17 MED ORDER — REGADENOSON 0.4 MG/5ML IV SOLN
INTRAVENOUS | Status: AC
  Filled 2023-04-17: qty 5

## 2023-04-17 MED ORDER — REGADENOSON 0.4 MG/5ML IV SOLN
0.4000 mg | Freq: Once | INTRAVENOUS | Status: AC
  Administered 2023-04-17: 0.4 mg via INTRAVENOUS

## 2023-04-17 MED ORDER — RUBIDIUM RB82 GENERATOR (RUBYFILL)
28.0300 | PACK | Freq: Once | INTRAVENOUS | Status: AC
  Administered 2023-04-17: 28.03 via INTRAVENOUS

## 2023-04-17 NOTE — Progress Notes (Signed)
Tolerated Lexiscan well.

## 2023-05-01 ENCOUNTER — Telehealth: Payer: Self-pay | Admitting: Family Medicine

## 2023-05-01 DIAGNOSIS — E291 Testicular hypofunction: Secondary | ICD-10-CM

## 2023-05-01 NOTE — Telephone Encounter (Signed)
 Copied from CRM [id number]. Topic: Clinical - Prescription Issue >> May 01, 2023  3:21 PM Eunice Blase wrote: Reason for CRM: Pt called was told testosterone cypionate (DEPOTESTOTERONE CYPIONATE) 100 MG/ML injection needs prior authorization. Please call [phone] pt when acquired.Marland Kitchen

## 2023-05-02 NOTE — Addendum Note (Signed)
 Addended by: Nanci Pina on: 05/02/2023 09:15 AM   Modules accepted: Orders

## 2023-05-02 NOTE — Telephone Encounter (Signed)
 PA approval from 04/06/2023- 2/208/2026.  Spoke with [facility] to relay info above. States they have that on file, pt just needs refill.  Name of Medication:  Testosterone inj, Ambien Name of Pharmacy:  [facility] Last Fill or Written Date and Quantity:        Testosterone inj:  02/22/23, #10 mL Last Office Visit and Type:  01/16/23, CPE Next Office Visit and Type:  01/22/24, CPE Last Controlled Substance Agreement Date: none Last UDS: none

## 2023-05-04 MED ORDER — TESTOSTERONE CYPIONATE 100 MG/ML IM SOLN: 200.0000 mg | INTRAMUSCULAR | 0 refills | Status: DC

## 2023-05-04 NOTE — Telephone Encounter (Signed)
 ERx

## 2023-05-16 ENCOUNTER — Other Ambulatory Visit: Payer: Self-pay | Admitting: Family Medicine

## 2023-05-16 DIAGNOSIS — E291 Testicular hypofunction: Secondary | ICD-10-CM

## 2023-05-17 DIAGNOSIS — Z5181 Encounter for therapeutic drug level monitoring: Secondary | ICD-10-CM | POA: Diagnosis not present

## 2023-05-17 DIAGNOSIS — F1120 Opioid dependence, uncomplicated: Secondary | ICD-10-CM | POA: Diagnosis not present

## 2023-05-17 NOTE — Telephone Encounter (Signed)
 Duplicate request. Rx sent 05/04/23 (see 05/01/23 refill note).

## 2023-05-18 NOTE — Telephone Encounter (Signed)
 ERx

## 2023-05-28 NOTE — Progress Notes (Signed)
 CARDIOLOGY CONSULT NOTE       Patient ID: William Friedman MRN: [id number] DOB/AGE: [birth year] 53 y.o.  Referring Physician: Mariam Shingles Primary Physician: Claire Crick, MD Primary Cardiologist: Stann Earnest Reason for Consultation: CAD/High calcium  score    HPI:  53 y.o. referred by Dr Crissie Dome for CAD, high calcium  score and family history of CAD. First seen by me on 03/09/23 He is overweight, with HTN and HLD. Most recent LDL 01/01/23 was 100. Looks like lipitor dose was increase to 40 mg Calcium  score done 11/06/22 reviewed. Total 514 with predominance in LAD this was 97 th percentile for age/sex. BP is well controlled on norvasc  and lotensin . His LPa was < 10 12/2021. Has been on testosterone  replacement in past most recent level 12/20/22 was normal 463.   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 Does have liquor ( whiskey/fireball) 3-4 times per week and when golfing   Has rare atypical sharp chest pains when golfing a lot   PET/CT done 04/17/23 normal perfusion, normal stress EF 57% and normal MBFR  He has a new bony growth over the medial aspect of his right clavicle Not really painful  No chest pain Discussed updating lipids and getting LDL < 70  Golfing a lot at [employer] one of my other friends/patient May retire soon from [employer] work with pension     ROS All other systems reviewed and negative except as noted above  Past Medical History:  Diagnosis Date   Chronic fatigue    Chronic pain    sees [facility] Valda Garnet)   DDD (degenerative disc disease), lumbar    chronic pain from this on chronic narcotics   Family history of early CAD    History of chicken pox    History of kidney stones    remote   History of migraine    HTN (hypertension) 02/07/2012    normal renal duplex   Hyperlipidemia    Hypogonadism male    Overweight(278.02)     Family History  Problem Relation Age of Onset   Hypertension Mother    Cancer Mother        breast   Hypertension Father    Diabetes Father    Stroke Father 89       ministroke   Coronary artery disease Father 32       CABG   Cancer Sister        breast   Heart disease Maternal Grandmother    Coronary artery disease Maternal Grandfather 30   Cancer Cousin 11       Ewing sarcoma   Colon cancer Neg Hx    Colon polyps Neg Hx    Esophageal cancer Neg Hx    Rectal cancer Neg Hx    Stomach cancer Neg Hx     Social History   Socioeconomic History   Marital status: Married    Spouse name: Not on file   Number of children: Not on file   Years of education: Not on file   Highest education level: Bachelor's degree (e.g., BA, AB, BS)  Occupational History   Not on file  Tobacco Use   Smoking status: Never   Smokeless tobacco: Never  Substance and Sexual Activity   Alcohol use: No    Comment: rarely  Drug use: Yes    Types: Fentanyl    Comment: Opioid---Oxycodone, Hydrocodone   Sexual activity: Not on file  Other Topics Concern   Not on file  Social History Narrative   Caffeine: sodas Dr Kathlene Paradise - 1L/day   Lives with wife and daughter, 1 dog   Occupation: special agent - [employer]   Edu: BS    Activity: golfing, likes sports.    Diet: poor - not regular fruits/vegetables, not regular water    Social Drivers of Health   Financial Resource Strain: Low Risk  (01/15/2023)   Overall Financial Resource Strain (CARDIA)    Difficulty of Paying Living Expenses: Not hard at all  Food Insecurity: No Food Insecurity (01/15/2023)   Hunger Vital Sign    Worried About Running Out of Food in the Last Year: Never true    Ran Out of Food in the Last Year: Never true  Transportation Needs: No Transportation Needs (01/15/2023)   PRAPARE - Administrator, Civil Service (Medical): No    Lack of  Transportation (Non-Medical): No  Physical Activity: Sufficiently Active (01/15/2023)   Exercise Vital Sign    Days of Exercise per Week: 5 days    Minutes of Exercise per Session: 120 min  Stress: No Stress Concern Present (01/15/2023)   [employer] of Occupational Health - Occupational Stress Questionnaire    Feeling of Stress : Not at all  Social Connections: Moderately Integrated (01/15/2023)   Social Connection and Isolation Panel [NHANES]    Frequency of Communication with Friends and Family: Three times a week    Frequency of Social Gatherings with Friends and Family: Three times a week    Attends Religious Services: 1 to 4 times per year    Active Member of Clubs or Organizations: No    Attends Banker Meetings: Not on file    Marital Status: Married  Intimate Partner Violence: Not on file    Past Surgical History:  Procedure Laterality Date   COLONOSCOPY  04/2022   WNL Cherryl Corona)   ELBOW SURGERY  02/07/1988   R (was pitcher in college)   ELBOW SURGERY  02/06/2002   R   KNEE CARTILAGE SURGERY Left 2022   SHOULDER SURGERY  02/07/1991   R      Current Outpatient Medications:    amLODipine  (NORVASC ) 10 MG tablet, Take 1 tablet (10 mg total) by mouth daily., Disp: 90 tablet, Rfl: 4   aspirin  EC 81 MG tablet, Take 1 tablet (81 mg total) by mouth daily. Swallow whole., Disp: , Rfl:    atorvastatin  (LIPITOR) 40 MG tablet, Take 1 tablet (40 mg total) by mouth daily., Disp: 90 tablet, Rfl: 4   benazepril  (LOTENSIN ) 10 MG tablet, Take 20 mg by mouth daily., Disp: , Rfl:    Buprenorphine HCl-Naloxone HCl 2-0.5 MG FILM, Place 1 tablet under the tongue every other day., Disp: , Rfl: 0   cyanocobalamin  (VITAMIN B12) 1000 MCG tablet, Take 1 tablet (1,000 mcg total) by mouth daily., Disp: , Rfl:    fenofibrate  (TRICOR ) 145 MG tablet, TAKE 1/2 TABLETS (72.5 MG TOTAL) BY MOUTH DAILY., Disp: 90 tablet, Rfl: 3   methocarbamol  (ROBAXIN ) 750 MG tablet, TAKE 1 TABLET BY  MOUTH THREE TIMES A DAY AS NEEDED FOR MUSCLE SPASM, Disp: 60 tablet, Rfl: 3   Multiple Vitamins-Minerals (MULTIVITAMIN PO), Take 1 tablet by mouth daily., Disp: , Rfl:    testosterone  cypionate (DEPOTESTOTERONE CYPIONATE) 100 MG/ML injection, INJECT 2 MLS (200 MG  TOTAL) INTO THE MUSCLE EVERY 14 (FOURTEEN) DAYS. FOR IM USE ONLY, Disp: 10 mL, Rfl: 3   zolpidem  (AMBIEN ) 10 MG tablet, TAKE 0.5-1 TABLETS (5-10 MG TOTAL) BY MOUTH AT BEDTIME AS NEEDED. FOR SLEEP, Disp: 30 tablet, Rfl: 1   benazepril  (LOTENSIN ) 10 MG tablet, Take 1 tablet (10 mg total) by mouth daily. (Patient not taking: Reported on 06/11/2023), Disp: 90 tablet, Rfl: 4    Physical Exam: Blood pressure 130/60, pulse 89, height 6\' 2"  (1.88 m), weight 235 lb 12.8 oz (107 kg), SpO2 97%.    Affect appropriate Overweight  HEENT: normal Neck supple with no adenopathy JVP normal no bruits no thyromegaly Lungs clear with no wheezing and good diaphragmatic motion Heart:  S1/S2 no murmur, no rub, gallop or click PMI normal Abdomen: benighn, BS positve, no tenderness, no AAA no bruit.  No HSM or HJR Distal pulses intact with no bruits No edema Neuro non-focal Skin warm and dry No muscular weakness   Labs:   Lab Results  Component Value Date   WBC 6.2 01/01/2023   HGB 14.0 01/01/2023   HCT 41.9 01/01/2023   MCV 87.5 01/01/2023   PLT 357.0 01/01/2023   No results for input(s): "NA", "K", "CL", "CO2", "BUN", "CREATININE", "CALCIUM ", "PROT", "BILITOT", "ALKPHOS", "ALT", "AST", "GLUCOSE" in the last 168 hours.  Invalid input(s): "LABALBU" No results found for: "CKTOTAL", "CKMB", "CKMBINDEX", "TROPONINI"  Lab Results  Component Value Date   CHOL 178 01/01/2023   CHOL 171 12/27/2021   CHOL 196 01/03/2021   Lab Results  Component Value Date   HDL 46.10 01/01/2023   HDL 50.90 12/27/2021   HDL 58.00 01/03/2021   Lab Results  Component Value Date   LDLCALC 100 (H) 01/01/2023   LDLCALC 84 12/27/2021   LDLCALC 116 (H)  01/03/2021   Lab Results  Component Value Date   TRIG 160.0 (H) 01/01/2023   TRIG 181.0 (H) 12/27/2021   TRIG 110.0 01/03/2021   Lab Results  Component Value Date   CHOLHDL 4 01/01/2023   CHOLHDL 3 12/27/2021   CHOLHDL 3 01/03/2021   Lab Results  Component Value Date   LDLDIRECT 144.0 12/15/2015   LDLDIRECT 99.0 06/03/2015   LDLDIRECT 126.0 05/11/2014      Radiology: No results found.  EKG: NSR normal 06/11/2023    ASSESSMENT AND PLAN:   CAD: subclinical high score for age > 400 primarily in LAD 43 th percentile Normal PET/CT 04/17/23 Continue statin and ASA. Surprisingly LPa is low when checked 12/2021 LDL 100 Lipitor increased to 40 mg 01/16/23 update labs HTN:  May need to increase lotensin  He will monitor at home Discussed issues with BP control and alcohol 2 drug Rx DASH diet Weight loss Low T: with family history of CAD and high calcium  score would not recommend replacement with normal Testosterone  levels Chronic Pain:  on robaxin  and buprenorphine/HCL-Naloxone F/U pain clinic Ortho right clavicular bone growth Offerred xray but he will defer to his primary  Lipid/Liver  F/U in a year   Signed: Janelle Mediate 06/11/2023, 2:53 PM

## 2023-06-11 ENCOUNTER — Ambulatory Visit: Payer: Federal, State, Local not specified - PPO | Attending: Cardiovascular Disease | Admitting: Cardiovascular Disease

## 2023-06-11 ENCOUNTER — Encounter: Payer: Self-pay | Admitting: Cardiovascular Disease

## 2023-06-11 VITALS — BP 130/60 | HR 89 | Ht 74.0 in | Wt 235.8 lb

## 2023-06-11 DIAGNOSIS — E782 Mixed hyperlipidemia: Secondary | ICD-10-CM | POA: Diagnosis not present

## 2023-06-11 DIAGNOSIS — I25118 Atherosclerotic heart disease of native coronary artery with other forms of angina pectoris: Secondary | ICD-10-CM | POA: Diagnosis not present

## 2023-06-11 DIAGNOSIS — I10 Essential (primary) hypertension: Secondary | ICD-10-CM | POA: Diagnosis not present

## 2023-06-11 DIAGNOSIS — D492 Neoplasm of unspecified behavior of bone, soft tissue, and skin: Secondary | ICD-10-CM

## 2023-06-11 NOTE — Patient Instructions (Addendum)
 Medication Instructions:  Your physician recommends that you continue on your current medications as directed. Please refer to the Current Medication list given to you today.  *If you need a refill on your cardiac medications before your next appointment, please call your pharmacy*  Lab Work: Your physician recommends that you having fasting lipid and liver panel at any [facility].   If you have labs (blood work) drawn today and your tests are completely normal, you will receive your results only by: MyChart Message (if you have MyChart) OR A paper copy in the mail If you have any lab test that is abnormal or we need to change your treatment, we will call you to review the results.  Testing/Procedures: None ordered today.  Follow-Up: At [facility], you and your health needs are our priority.  As part of our continuing mission to provide you with exceptional heart care, our providers are all part of one team.  This team includes your primary Cardiologist (physician) and Advanced Practice Providers or APPs (Physician Assistants and Nurse Practitioners) who all work together to provide you with the care you need, when you need it.  Your next appointment:   1 year(s)  Provider:   Janelle Mediate, MD    We recommend signing up for the patient portal called "MyChart".  Sign up information is provided on this After Visit Summary.  MyChart is used to connect with patients for Virtual Visits (Telemedicine).  Patients are able to view lab/test results, encounter notes, upcoming appointments, etc.  Non-urgent messages can be sent to your provider as well.   To learn more about what you can do with MyChart, go to [email or web address].   You may also go to any of these [facility] locations:   [facility] - [address] ([facility]) - [address] [phone] [address]   [place] - [address]    [place]  -  [address]    [place] - [address] - [address] [employer]  - [address] ([facility]

## 2023-06-18 ENCOUNTER — Telehealth: Payer: Self-pay | Admitting: Cardiovascular Disease

## 2023-06-18 NOTE — Telephone Encounter (Signed)
 Paper Work Dropped Off: Verification of services from insurance   Date:06/18/2023  Location of paper:  Dr Stann Earnest mail box

## 2023-06-21 DIAGNOSIS — M25511 Pain in right shoulder: Secondary | ICD-10-CM | POA: Diagnosis not present

## 2023-06-26 NOTE — Telephone Encounter (Signed)
 Left message that notes were faxed to patient's insurance company. Requested call back if patient has any questions.

## 2023-06-30 ENCOUNTER — Other Ambulatory Visit: Payer: Self-pay | Admitting: Family Medicine

## 2023-06-30 DIAGNOSIS — G894 Chronic pain syndrome: Secondary | ICD-10-CM

## 2023-07-03 ENCOUNTER — Other Ambulatory Visit: Payer: Self-pay | Admitting: Family Medicine

## 2023-07-03 DIAGNOSIS — F5104 Psychophysiologic insomnia: Secondary | ICD-10-CM

## 2023-07-03 NOTE — Telephone Encounter (Unsigned)
 Copied from CRM [id number]. Topic: Clinical - Medication Refill >> Jul 03, 2023 11:08 AM Alpha Arts wrote: Medication: zolpidem  (AMBIEN ) 10 MG tablet   Has the patient contacted their pharmacy? Yes (Agent: If no, request that the patient contact the pharmacy for the refill. If patient does not wish to contact the pharmacy document the reason why and proceed with request.) (Agent: If yes, when and what did the pharmacy advise?)  This is the patient's preferred pharmacy:  [id number] [place] [facility] [address] [place] [address] Phone: [phone] Fax: [phone]  Is this the correct pharmacy for this prescription? Yes If no, delete pharmacy and type the correct one.   Has the prescription been filled recently? Yes  Is the patient out of the medication? Yes  Has the patient been seen for an appointment in the last year OR does the patient have an upcoming appointment? Yes  Can we respond through MyChart? Yes  Agent: Please be advised that Rx refills may take up to 3 business days. We ask that you follow-up with your pharmacy.

## 2023-07-04 NOTE — Telephone Encounter (Signed)
 Name of Medication:  Ambien  Name of Pharmacy:  [facility] Last Fill or Written Date and Quantity:  02/22/23, #30 Last Office Visit and Type:  01/16/23, CPE Next Office Visit and Type:  01/22/24, CPE Last Controlled Substance Agreement Date: none Last UDS: none

## 2023-07-06 MED ORDER — ZOLPIDEM TARTRATE 10 MG PO TABS
5.0000 mg | ORAL_TABLET | Freq: Every evening | ORAL | 1 refills | Status: DC | PRN
Start: 2023-07-06 — End: 2023-10-17

## 2023-07-06 NOTE — Telephone Encounter (Signed)
 ERx

## 2023-07-10 ENCOUNTER — Other Ambulatory Visit: Payer: Self-pay | Admitting: Family Medicine

## 2023-07-10 DIAGNOSIS — E291 Testicular hypofunction: Secondary | ICD-10-CM

## 2023-07-10 NOTE — Telephone Encounter (Signed)
 Name of Medication:  Testosterone  inj Name of Pharmacy:  [facility] Last Fill or Written Date and Quantity:  05/21/23, #10 mL Last Office Visit and Type:  01/16/23, CPE Next Office Visit and Type:  01/22/24, CPE Last Controlled Substance Agreement Date: none Last UDS: none

## 2023-07-12 DIAGNOSIS — Z5181 Encounter for therapeutic drug level monitoring: Secondary | ICD-10-CM | POA: Diagnosis not present

## 2023-07-12 DIAGNOSIS — F1120 Opioid dependence, uncomplicated: Secondary | ICD-10-CM | POA: Diagnosis not present

## 2023-07-12 NOTE — Telephone Encounter (Signed)
 ERx

## 2023-07-16 ENCOUNTER — Other Ambulatory Visit: Payer: Self-pay | Admitting: Family Medicine

## 2023-07-16 DIAGNOSIS — E291 Testicular hypofunction: Secondary | ICD-10-CM

## 2023-07-17 ENCOUNTER — Other Ambulatory Visit (INDEPENDENT_AMBULATORY_CARE_PROVIDER_SITE_OTHER): Payer: Federal, State, Local not specified - PPO

## 2023-07-17 DIAGNOSIS — E291 Testicular hypofunction: Secondary | ICD-10-CM | POA: Diagnosis not present

## 2023-07-23 ENCOUNTER — Ambulatory Visit: Payer: Self-pay | Admitting: Family Medicine

## 2023-08-08 ENCOUNTER — Other Ambulatory Visit: Payer: Self-pay | Admitting: Cardiovascular Disease

## 2023-08-08 DIAGNOSIS — I10 Essential (primary) hypertension: Secondary | ICD-10-CM

## 2023-08-08 NOTE — Telephone Encounter (Signed)
 Left message for patient to call back

## 2023-08-08 NOTE — Telephone Encounter (Signed)
 Pt is requesting a refill on medication benazepril . This medication was prescribed by pt's PCP. Would Dr. Nishan like to refill this medication? Please address

## 2023-08-08 NOTE — Telephone Encounter (Signed)
*  STAT* If patient is at the pharmacy, call can be transferred to refill team.   1. Which medications need to be refilled? (please list name of each medication and dose if known) benazepril  (LOTENSIN ) 10 MG tablet   2. Which pharmacy/location (including street and city if local pharmacy) is medication to be sent to?  [id number] [place] [place] - [address]    3. Do they need a 30 day or 90 day supply? 90

## 2023-08-15 ENCOUNTER — Other Ambulatory Visit (HOSPITAL_COMMUNITY): Payer: Self-pay

## 2023-08-15 ENCOUNTER — Other Ambulatory Visit: Payer: Self-pay | Admitting: Family Medicine

## 2023-08-15 ENCOUNTER — Telehealth: Payer: Self-pay | Admitting: Cardiovascular Disease

## 2023-08-15 DIAGNOSIS — E291 Testicular hypofunction: Secondary | ICD-10-CM

## 2023-08-15 DIAGNOSIS — I10 Essential (primary) hypertension: Secondary | ICD-10-CM

## 2023-08-15 MED ORDER — BENAZEPRIL HCL 10 MG PO TABS
10.0000 mg | ORAL_TABLET | Freq: Every day | ORAL | 4 refills | Status: DC
  Filled 2023-08-15: qty 90, 90d supply, fill #0

## 2023-08-15 MED ORDER — BENAZEPRIL HCL 10 MG PO TABS: 10.0000 mg | ORAL_TABLET | Freq: Every day | ORAL | 3 refills | Status: DC

## 2023-08-15 NOTE — Telephone Encounter (Signed)
 Pt's medication was sent to pt's pharmacy as requested. Confirmation received.

## 2023-08-15 NOTE — Telephone Encounter (Signed)
 Copied from CRM [id number]. Topic: Clinical - Medication Refill >> Aug 15, 2023  9:44 AM Martinique E wrote: Medication: testosterone  cypionate (DEPOTESTOTERONE CYPIONATE) 100 MG/ML injection  Has the patient contacted their pharmacy? No (Agent: If no, request that the patient contact the pharmacy for the refill. If patient does not wish to contact the pharmacy document the reason why and proceed with request.) (Agent: If yes, when and what did the pharmacy advise?)  This is the patient's preferred pharmacy:  [id number] [place] - [address] [place] [address] Phone: [phone] Fax: [phone]  Is this the correct pharmacy for this prescription? Yes If no, delete pharmacy and type the correct one.   Has the prescription been filled recently? No  Is the patient out of the medication? Yes, accidentally threw away the remainder of his medication.  Has the patient been seen for an appointment in the last year OR does the patient have an upcoming appointment? Yes  Can we respond through MyChart? Yes  Agent: Please be advised that Rx refills may take up to 3 business days. We ask that you follow-up with your pharmacy.

## 2023-08-15 NOTE — Telephone Encounter (Signed)
*  STAT* If patient is at the pharmacy, call can be transferred to refill team.   1. Which medications need to be refilled? (please list name of each medication and dose if known)   benazepril  (LOTENSIN ) 10 MG tablet   2. Would you like to learn more about the convenience, safety, & potential cost savings by using the [facility]?   3. Are you open to using the [facility] (Type [facility]. ).  4. Which pharmacy/location (including street and city if local pharmacy) is medication to be sent to?  [id number] [place] [place] - [address]   5. Do they need a 30 day or 90 day supply?   90 day  Patient called to follow-up on his prescription to [id number] [place] [place] - [address] not [employer].

## 2023-08-16 NOTE — Telephone Encounter (Signed)
 Name of Medication:  Testosterone  inj Name of Pharmacy:  [facility] Last Fill or Written Date and Quantity:  07/12/23, #10 mL Last Office Visit and Type:  01/16/23, CPE Next Office Visit and Type:  01/22/24, CPE Last Controlled Substance Agreement Date: none Last UDS: none

## 2023-08-17 ENCOUNTER — Telehealth: Payer: Self-pay

## 2023-08-17 MED ORDER — TESTOSTERONE CYPIONATE 100 MG/ML IM SOLN: 200.0000 mg | INTRAMUSCULAR | 2 refills | Status: DC

## 2023-08-17 MED ORDER — BENAZEPRIL HCL 20 MG PO TABS: 20.0000 mg | ORAL_TABLET | Freq: Every day | ORAL | 3 refills | Status: AC

## 2023-08-17 NOTE — Telephone Encounter (Signed)
 Patient is following up. He says the pharmacy informed him that the prescription was never received. He would like to have it sent in again if possible.

## 2023-08-17 NOTE — Telephone Encounter (Signed)
 Spoke with the patient who states that he has been taking benazepril  20 mg daily and needs a new prescription with this change. He states that his blood pressure has been good but he does not have any readings to provide. Will send to Dr. Delford to confirm that it is okay to update prescription.

## 2023-08-17 NOTE — Telephone Encounter (Signed)
Plz notify this was sent in.  ?

## 2023-08-17 NOTE — Telephone Encounter (Signed)
 ERx

## 2023-08-17 NOTE — Telephone Encounter (Unsigned)
 Copied from CRM [id number]. Topic: Clinical - Medication Question >> Aug 17, 2023 10:27 AM Mia F wrote: Reason for CRM: Patient is calling back to check the status of his medication, He has been informed that it is in the pending status and waiting for dr to sign off. Pt is aware that refills can take up to 3 business days.

## 2023-08-17 NOTE — Telephone Encounter (Signed)
 Copied from CRM [id number]. Topic: Clinical - Medication Question >> Aug 17, 2023 10:27 AM Mia F wrote: Reason for CRM: Patient is calling back to check the status of his medication, He has been informed that it is in the pending status and waiting for dr to sign off. Pt is aware that refills can take up to 3 business days.

## 2023-08-17 NOTE — Telephone Encounter (Signed)
 Called patient reviewed all information and repeated back to me. Will call if any questions.  ? ?

## 2023-08-17 NOTE — Telephone Encounter (Signed)
Prescription has been updated

## 2023-08-17 NOTE — Addendum Note (Signed)
 Addended by: CHAUVIGNE, Remell Giaimo on: 08/17/2023 01:08 PM   Modules accepted: Orders

## 2023-08-17 NOTE — Telephone Encounter (Signed)
 Pt calling stating that Dr. Nishan increased his medication benazepril  10 mg to 20 mg daily. This was not updated on pt's medication list and I do not see in Dr. Delford last office note where he stated for pt to increase medication. Please address

## 2023-08-20 ENCOUNTER — Other Ambulatory Visit: Payer: Self-pay | Admitting: Family Medicine

## 2023-08-20 DIAGNOSIS — E291 Testicular hypofunction: Secondary | ICD-10-CM

## 2023-08-20 NOTE — Telephone Encounter (Signed)
 Copied from CRM [id number]. Topic: Clinical - Medication Refill >> Aug 15, 2023  9:44 AM Martinique E wrote: Medication: testosterone  cypionate (DEPOTESTOTERONE CYPIONATE) 100 MG/ML injection  Has the patient contacted their pharmacy? No (Agent: If no, request that the patient contact the pharmacy for the refill. If patient does not wish to contact the pharmacy document the reason why and proceed with request.) (Agent: If yes, when and what did the pharmacy advise?)  This is the patient's preferred pharmacy:  [phone] [place] [facility] [address] [place] [address] Phone: [phone] Fax: [phone]  Is this the correct pharmacy for this prescription? Yes If no, delete pharmacy and type the correct one.   Has the prescription been filled recently? No  Is the patient out of the medication? Yes, accidentally threw away the remainder of his medication.  Has the patient been seen for an appointment in the last year OR does the patient have an upcoming appointment? Yes  Can we respond through MyChart? Yes  Agent: Please be advised that Rx refills may take up to 3 business days. We ask that you follow-up with your pharmacy. >> Aug 20, 2023 12:11 PM Leotis ORN wrote: [facility] verbal confirmation from the provider to authorize an early refill of the medication.

## 2023-08-20 NOTE — Telephone Encounter (Signed)
 Rx sent 08/17/23, #10/mL/ 2 refills to [facility].   Spoke with pt relaying info above. Pt states he is aware but [facility] is stating it's too early for his refill (not due until Aug). But they will fill it with provider's authorization.

## 2023-08-20 NOTE — Telephone Encounter (Signed)
 One 10mL vial should last 2.5 months, and last filled 07/12/2023 Is he already out?

## 2023-08-21 NOTE — Telephone Encounter (Signed)
 Spoke with pt relaying Dr Talmadge message. Pt states he accidentally threw out a new vial.

## 2023-08-23 MED ORDER — TESTOSTERONE CYPIONATE 100 MG/ML IM SOLN: 200.0000 mg | INTRAMUSCULAR | 2 refills | Status: DC

## 2023-08-23 NOTE — Telephone Encounter (Signed)
 ERx Ok to fill early

## 2023-10-01 ENCOUNTER — Telehealth: Payer: Self-pay

## 2023-10-01 MED ORDER — TESTOSTERONE CYPIONATE 200 MG/ML IM SOLN
200.0000 mg | INTRAMUSCULAR | 1 refills | Status: AC
Start: 2023-10-01 — End: ?

## 2023-10-01 NOTE — Telephone Encounter (Addendum)
 ERx new dose testosterone  per pharmacy request.

## 2023-10-01 NOTE — Addendum Note (Signed)
 Addended by: RILLA BALLER on: 10/01/2023 04:38 PM   Modules accepted: Orders

## 2023-10-09 DIAGNOSIS — F1120 Opioid dependence, uncomplicated: Secondary | ICD-10-CM | POA: Diagnosis not present

## 2023-10-09 DIAGNOSIS — Z5181 Encounter for therapeutic drug level monitoring: Secondary | ICD-10-CM | POA: Diagnosis not present

## 2023-10-10 ENCOUNTER — Other Ambulatory Visit (HOSPITAL_COMMUNITY): Payer: Self-pay

## 2023-10-10 ENCOUNTER — Telehealth: Payer: Self-pay

## 2023-10-10 NOTE — Telephone Encounter (Signed)
 Pharmacy Patient Advocate Encounter   Received notification from Pt Calls Messages that prior authorization for Testosterone  Cypionate 200MG /ML intramuscular solution is required/requested.   Insurance verification completed.   The patient is insured through [facility] .   Per test claim: PA required; PA started via CoverMyMeds. KEY [id number] . Waiting for clinical questions to populate.

## 2023-10-10 NOTE — Telephone Encounter (Signed)
 PA request has been Started. New Encounter has been or will be created for follow up. For additional info see Pharmacy Prior Auth telephone encounter from 10/10/23.

## 2023-10-10 NOTE — Telephone Encounter (Unsigned)
 Copied from CRM [id number]. Topic: Clinical - Medication Prior Auth >> Oct 10, 2023  9:34 AM Robinson H wrote: Reason for CRM: Patient is calling stating per pharmacy he needs a prior authorization for the testosterone  cypionate (DEPOTESTOSTERONE CYPIONATE) 200 MG/ML injection  Phil [phone]

## 2023-10-10 NOTE — Telephone Encounter (Signed)
 Pharmacy Patient Advocate Encounter  Received notification from [facility] that Prior Authorization for Testosterone  Cypionate 200MG /ML intramuscular solution  has been APPROVED from 09/10/23 to 10/09/24. Ran test claim, Copay is $40 (3 MONTH SUPPLY). This test claim was processed through [facility]- copay amounts may vary at other pharmacies due to pharmacy/plan contracts, or as the patient moves through the different stages of their insurance plan.

## 2023-10-10 NOTE — Telephone Encounter (Signed)
 Pharmacy Patient Advocate Encounter  Received notification from [facility] that Prior Authorization for Testosterone  Cypionate 200MG /ML intramuscular solution  has been CANCELLED by the processor we sent it to: Resubmitted through Phone to [employer]

## 2023-10-15 ENCOUNTER — Other Ambulatory Visit: Payer: Self-pay | Admitting: Family Medicine

## 2023-10-15 DIAGNOSIS — F5104 Psychophysiologic insomnia: Secondary | ICD-10-CM

## 2023-10-16 ENCOUNTER — Telehealth: Payer: Self-pay

## 2023-10-16 NOTE — Telephone Encounter (Signed)
 Copied from CRM [id number]. Topic: Clinical - Prescription Issue >> Oct 15, 2023  4:32 PM Martinique E wrote: Reason for CRM: Patient called in stating he was just at the pharmacy to pick up his testosterone  cypionate (DEPOTESTOSTERONE CYPIONATE) 200 MG/ML injection, and they were stating a PA needed to be completed for this medication, however the PA was approved already from 09/10/2023-10/09/2024, so the pharmacy did not dispense this medication to the patient, even though the receipt was confirmed by the pharmacy on 10/01/2023.

## 2023-10-16 NOTE — Telephone Encounter (Signed)
 Can we call pharmacy to notify them that PA has been completed and ask them to let pt know when ok to pick up?

## 2023-10-16 NOTE — Telephone Encounter (Signed)
 Name of Medication:  Ambien  Name of Pharmacy:  [facility] Last Fill or Written Date and Quantity:  08/26/23, #30 Last Office Visit and Type:  01/16/23, CPE Next Office Visit and Type:  01/22/24, CPE Last Controlled Substance Agreement Date: none Last UDS: none

## 2023-10-17 ENCOUNTER — Other Ambulatory Visit (HOSPITAL_COMMUNITY): Payer: Self-pay

## 2023-10-17 NOTE — Telephone Encounter (Signed)
 ERx

## 2023-10-17 NOTE — Telephone Encounter (Signed)
 Called pharmacy they have ran and are able to fill now. I have let patient know.

## 2023-10-22 ENCOUNTER — Other Ambulatory Visit (HOSPITAL_COMMUNITY): Payer: Self-pay

## 2023-10-24 ENCOUNTER — Other Ambulatory Visit (HOSPITAL_COMMUNITY): Payer: Self-pay

## 2023-10-24 ENCOUNTER — Telehealth: Payer: Self-pay

## 2023-10-24 NOTE — Telephone Encounter (Signed)
 Pharmacy Patient Advocate Encounter   Received notification from Onbase that prior authorization for Zolpidem  Tartrate 10 tabs is due for renewal.   Insurance verification completed.   The patient is insured through [facility].  Action: PA required and submitted KEY/EOC/Request #: [id number] from 10/24/23 to 10/23/24. Unable to obtain price due to refill too soon rejection, last fill date 10/17/23 next available fill date10/4/25

## 2023-11-07 ENCOUNTER — Other Ambulatory Visit (HOSPITAL_COMMUNITY): Payer: Self-pay

## 2023-11-07 ENCOUNTER — Telehealth: Payer: Self-pay

## 2023-11-07 NOTE — Telephone Encounter (Signed)
 Pharmacy Patient Advocate Encounter   Received notification from Onbase that prior authorization for Testosterone  Cyp 200 is required/requested.   Insurance verification completed.   The patient is insured through [employer].   Per test claim: Refill too soon. PA is not needed at this time. Medication was filled 10/17/23. Next eligible fill date is 12/23/23. Current approved PA expires 10/10/24.

## 2023-11-14 ENCOUNTER — Other Ambulatory Visit: Payer: Self-pay | Admitting: Family Medicine

## 2023-11-14 DIAGNOSIS — G894 Chronic pain syndrome: Secondary | ICD-10-CM

## 2023-12-04 ENCOUNTER — Telehealth: Payer: Self-pay

## 2023-12-04 ENCOUNTER — Other Ambulatory Visit (HOSPITAL_COMMUNITY): Payer: Self-pay

## 2023-12-04 NOTE — Telephone Encounter (Signed)
 Pharmacy Patient Advocate Encounter   Received notification from Onbase that prior authorization for Testosterone  Cyp 200 is required/requested.   Insurance verification completed.   The patient is insured through [facility].   Per test claim: Refill too soon. PA is not needed at this time. Medication was filled 10/17/23. Next eligible fill date is 12/23/23.   Patient has current approved PA that expires 10/10/24.

## 2023-12-07 ENCOUNTER — Other Ambulatory Visit (HOSPITAL_COMMUNITY): Payer: Self-pay

## 2023-12-11 DIAGNOSIS — Z5181 Encounter for therapeutic drug level monitoring: Secondary | ICD-10-CM | POA: Diagnosis not present

## 2023-12-11 DIAGNOSIS — F1120 Opioid dependence, uncomplicated: Secondary | ICD-10-CM | POA: Diagnosis not present

## 2023-12-21 NOTE — Telephone Encounter (Signed)
 LM for pt to return call.

## 2023-12-21 NOTE — Telephone Encounter (Addendum)
 LM for pt to return call.   Please provide message from provider/office when call is returned from patient.    Per test claim: Refill too soon. PA is not needed at this time. Medication was filled 10/17/23. Next eligible fill date is 12/23/23. The pharmacy is going to show a PA is needed because the medication is trying to be refilled before it can be.   Patient has current approved PA that expires 10/10/24.

## 2023-12-21 NOTE — Telephone Encounter (Unsigned)
 Copied from CRM [id number]. Topic: Clinical - Medication Prior Auth >> Dec 21, 2023 10:10 AM Dedra B wrote: Reason for CRM: Pt called regarding PA for testosterone  cypionate (DEPOTESTOSTERONE CYPIONATE) 200 MG/ML injection. Relayed message verbatim to pt. [facility]. He would like someone to call them and relay the message that he doesn't need one. >> Dec 21, 2023 11:24 AM Asberry HERO wrote: Patient called back to speak with Manuelita regarding the VM.  Call disconnected after waiting on hold with CAL.  Tried to call patient back and got VM.  Please try to contact him again.

## 2023-12-21 NOTE — Telephone Encounter (Unsigned)
 Copied from CRM [id number]. Topic: Clinical - Medication Prior Auth >> Dec 21, 2023 10:10 AM Dedra B wrote: Reason for CRM: Pt called regarding PA for testosterone  cypionate (DEPOTESTOSTERONE CYPIONATE) 200 MG/ML injection. Relayed message verbatim to pt. [facility]. He would like someone to call them and relay the message that he doesn't need one.

## 2023-12-26 NOTE — Telephone Encounter (Signed)
 Patient called read message to patient that CMA was attempting to relay.  Patient acknoledged and will call pharmacy

## 2024-01-09 ENCOUNTER — Other Ambulatory Visit (HOSPITAL_COMMUNITY): Payer: Self-pay

## 2024-01-09 ENCOUNTER — Telehealth: Payer: Self-pay

## 2024-01-09 NOTE — Telephone Encounter (Signed)
 Pharmacy Patient Advocate Encounter   Received notification from Onbase that prior authorization for Testosterone  Cypionate 200 is required/requested.   Insurance verification completed.   The patient is insured through [facility].   Per test claim: Refill too soon. PA is not needed at this time. Medication was filled 01/06/24. Next eligible fill date is 03/13/24.   Patient has current approved PA that expires 10/10/24.

## 2024-01-11 NOTE — Telephone Encounter (Signed)
 Plz notify pt of below - PA approved through 10/10/2024. Next refill is not due until 03/13/2024.   The pharmacy is going to show a PA is needed because the medication is trying to be refilled before it can be.

## 2024-01-11 NOTE — Telephone Encounter (Addendum)
 Left message to return call to our office.  Please provide  message from provider/office when call is returned from patient.

## 2024-01-12 ENCOUNTER — Other Ambulatory Visit: Payer: Self-pay | Admitting: Family Medicine

## 2024-01-12 DIAGNOSIS — R7303 Prediabetes: Secondary | ICD-10-CM

## 2024-01-12 DIAGNOSIS — E291 Testicular hypofunction: Secondary | ICD-10-CM

## 2024-01-12 DIAGNOSIS — E785 Hyperlipidemia, unspecified: Secondary | ICD-10-CM

## 2024-01-12 DIAGNOSIS — Z125 Encounter for screening for malignant neoplasm of prostate: Secondary | ICD-10-CM

## 2024-01-12 DIAGNOSIS — E538 Deficiency of other specified B group vitamins: Secondary | ICD-10-CM

## 2024-01-14 ENCOUNTER — Other Ambulatory Visit

## 2024-01-14 DIAGNOSIS — E538 Deficiency of other specified B group vitamins: Secondary | ICD-10-CM

## 2024-01-14 DIAGNOSIS — E785 Hyperlipidemia, unspecified: Secondary | ICD-10-CM

## 2024-01-14 DIAGNOSIS — Z125 Encounter for screening for malignant neoplasm of prostate: Secondary | ICD-10-CM | POA: Diagnosis not present

## 2024-01-14 DIAGNOSIS — E291 Testicular hypofunction: Secondary | ICD-10-CM | POA: Diagnosis not present

## 2024-01-14 DIAGNOSIS — R7303 Prediabetes: Secondary | ICD-10-CM | POA: Diagnosis not present

## 2024-01-14 LAB — LIPID PANEL
Cholesterol: 165 mg/dL (ref 0–200)
HDL: 48.2 mg/dL (ref >39.00)
LDL Cholesterol: 92 mg/dL (ref 0–99)
NonHDL: 117.29
Total CHOL/HDL Ratio: 3
Triglycerides: 126 mg/dL (ref 0.0–149.0)
VLDL: 25.2 mg/dL (ref 0.0–40.0)

## 2024-01-14 LAB — PSA: PSA: 1.1 ng/mL (ref 0.10–4.00)

## 2024-01-14 LAB — CBC WITH DIFFERENTIAL/PLATELET
Basophils Absolute: 0 K/uL (ref 0.0–0.1)
Basophils Relative: 0.7 % (ref 0.0–3.0)
Eosinophils Absolute: 0.5 K/uL (ref 0.0–0.7)
Eosinophils Relative: 7.7 % — ABNORMAL HIGH (ref 0.0–5.0)
HCT: 41.5 % (ref 39.0–52.0)
Hemoglobin: 14 g/dL (ref 13.0–17.0)
Lymphocytes Relative: 36.7 % (ref 12.0–46.0)
Lymphs Abs: 2.2 K/uL (ref 0.7–4.0)
MCHC: 33.7 g/dL (ref 30.0–36.0)
MCV: 85.5 fl (ref 78.0–100.0)
Monocytes Absolute: 0.5 K/uL (ref 0.1–1.0)
Monocytes Relative: 8.4 % (ref 3.0–12.0)
Neutro Abs: 2.8 K/uL (ref 1.4–7.7)
Neutrophils Relative %: 46.5 % (ref 43.0–77.0)
Platelets: 356 K/uL (ref 150.0–400.0)
RBC: 4.86 Mil/uL (ref 4.22–5.81)
RDW: 13 % (ref 11.5–15.5)
WBC: 6.1 K/uL (ref 4.0–10.5)

## 2024-01-14 LAB — COMPREHENSIVE METABOLIC PANEL WITH GFR
ALT: 28 U/L (ref 0–53)
AST: 25 U/L (ref 0–37)
Albumin: 4.5 g/dL (ref 3.5–5.2)
Alkaline Phosphatase: 38 U/L — ABNORMAL LOW (ref 39–117)
BUN: 20 mg/dL (ref 6–23)
CO2: 26 meq/L (ref 19–32)
Calcium: 9.2 mg/dL (ref 8.4–10.5)
Chloride: 103 meq/L (ref 96–112)
Creatinine, Ser: 1.19 mg/dL (ref 0.40–1.50)
GFR: 68.94 mL/min (ref >60.00)
Glucose, Bld: 90 mg/dL (ref 70–99)
Potassium: 4.4 meq/L (ref 3.5–5.1)
Sodium: 138 meq/L (ref 135–145)
Total Bilirubin: 0.5 mg/dL (ref 0.2–1.2)
Total Protein: 7 g/dL (ref 6.0–8.3)

## 2024-01-14 LAB — HEMOGLOBIN A1C: Hgb A1c MFr Bld: 5.6 % (ref 4.6–6.5)

## 2024-01-14 LAB — TESTOSTERONE: Testosterone: 801.65 ng/dL (ref 300.00–890.00)

## 2024-01-14 LAB — VITAMIN B12: Vitamin B-12: 339 pg/mL (ref 211–911)

## 2024-01-15 ENCOUNTER — Ambulatory Visit: Payer: Self-pay | Admitting: Family Medicine

## 2024-01-15 ENCOUNTER — Other Ambulatory Visit: Payer: Federal, State, Local not specified - PPO

## 2024-01-22 ENCOUNTER — Ambulatory Visit: Payer: Federal, State, Local not specified - PPO | Admitting: Family Medicine

## 2024-01-22 ENCOUNTER — Encounter: Payer: Self-pay | Admitting: Family Medicine

## 2024-01-22 VITALS — BP 128/84 | HR 61 | Temp 97.7°F | Ht 72.84 in | Wt 233.2 lb

## 2024-01-22 DIAGNOSIS — E785 Hyperlipidemia, unspecified: Secondary | ICD-10-CM

## 2024-01-22 DIAGNOSIS — I10 Essential (primary) hypertension: Secondary | ICD-10-CM

## 2024-01-22 DIAGNOSIS — G894 Chronic pain syndrome: Secondary | ICD-10-CM

## 2024-01-22 DIAGNOSIS — Z23 Encounter for immunization: Secondary | ICD-10-CM

## 2024-01-22 DIAGNOSIS — R7303 Prediabetes: Secondary | ICD-10-CM | POA: Diagnosis not present

## 2024-01-22 DIAGNOSIS — Z0000 Encounter for general adult medical examination without abnormal findings: Secondary | ICD-10-CM | POA: Diagnosis not present

## 2024-01-22 DIAGNOSIS — R931 Abnormal findings on diagnostic imaging of heart and coronary circulation: Secondary | ICD-10-CM | POA: Diagnosis not present

## 2024-01-22 DIAGNOSIS — Z8249 Family history of ischemic heart disease and other diseases of the circulatory system: Secondary | ICD-10-CM

## 2024-01-22 DIAGNOSIS — E291 Testicular hypofunction: Secondary | ICD-10-CM

## 2024-01-22 DIAGNOSIS — F5104 Psychophysiologic insomnia: Secondary | ICD-10-CM

## 2024-01-22 DIAGNOSIS — E538 Deficiency of other specified B group vitamins: Secondary | ICD-10-CM | POA: Diagnosis not present

## 2024-01-22 DIAGNOSIS — E66811 Obesity, class 1: Secondary | ICD-10-CM | POA: Diagnosis not present

## 2024-01-22 MED ORDER — ATORVASTATIN CALCIUM 40 MG PO TABS: 40.0000 mg | ORAL_TABLET | Freq: Every day | ORAL | 3 refills | Status: AC

## 2024-01-22 MED ORDER — AMLODIPINE BESYLATE 10 MG PO TABS: 10.0000 mg | ORAL_TABLET | Freq: Every day | ORAL | 3 refills | Status: AC

## 2024-01-22 MED ORDER — FENOFIBRATE 145 MG PO TABS: 72.5000 mg | ORAL_TABLET | Freq: Every day | ORAL | 3 refills | Status: AC

## 2024-01-22 MED ORDER — ZOLPIDEM TARTRATE 10 MG PO TABS: 5.0000 mg | ORAL_TABLET | Freq: Every evening | ORAL | 3 refills | Status: AC | PRN

## 2024-01-22 NOTE — Assessment & Plan Note (Signed)
 A1c remains normal range at 5.6%

## 2024-01-22 NOTE — Patient Instructions (Addendum)
 Flu shot today  Prevnar-20 today.  You are doing well today.  Continue current medicines. Return in 6 months for lab visit only.  Return in 1 year for next physical

## 2024-01-22 NOTE — Assessment & Plan Note (Signed)
 Preventative protocols reviewed and updated unless pt declined. Discussed healthy diet and lifestyle.

## 2024-01-22 NOTE — Progress Notes (Signed)
 Ph: [phone] Fax: [phone]   Patient ID: William Friedman, male    DOB: December 24, 1970, 53 y.o.   MRN: [id number]  This visit was conducted in person.  BP 128/84 (Cuff Size: Large)   Pulse 61   Temp 97.7 F (36.5 C) (Oral)   Ht 6' 0.84 (1.85 m)   Wt 233 lb 3.2 oz (105.8 kg)   SpO2 96%   BMI 30.91 kg/m    CC: CPE Subjective:   HPI: William Friedman is a 53 y.o. male presenting on 01/22/2024 for Annual Exam (Due for flu, /Would like to discuss need for tdap, prevnar, colons///)   Planning on retiring at end of year! Looking forward to this  Trazodone  ineffective for insomnia - continues ambien  10mg  for sleep, 1/2 tab at a time. No parasomnias.   Chronic pain followed by [facility] on suboxone  Fmhx premature CAD. Lp(a) <10 (12/2021). Coronary calcium  score 514.  Saw cardiology Dr Delford - normal PET/CT 04/17/2023 - continues statin, aspirin . Continues atorvastatin  40mg  daily. Planned annual monitoring.   Hypogonadism - IM testosterone  cypionate 200mg  Q2 wk replacement started late 09/2022.    Preventative: Colonoscopy 04/2022 - WNL [place])  Prostate cancer screening - continues yearly PSA.  Lung cancer screening - not eligible  Flu shot - yearly COVID vaccine Pfizer 03/2019 x2, no booster.  Tetanus ~2012 - declines  Prevnar-20 - discussed, today  Shingrix  - 12/2021, 09/2022 Seat belt use discussed  Sunscreen use discussed. No changing moles on skin.  Sleep - averaging 6-7 hours/night  Non smoker  Alcohol - 3-4 mixed drinks/wk  Dentist - yearly  Eye exam - yearly, s/p lasik 2006    Lives with wife and daughter, 1 dog  Occupation: psychologist, sport and exercise - [employer]  Edu: BS  Activity: golfing, walking 9 holes 3-4 times a week, band resistance training at home  Diet: good water, backed off soda, fish and fruits/vegetables regularly      Relevant past medical, surgical, family and social history reviewed and updated as indicated. Interim  medical history since our last visit reviewed. Allergies and medications reviewed and updated. Outpatient Medications Prior to Visit  Medication Sig Dispense Refill   aspirin  EC 81 MG tablet Take 1 tablet (81 mg total) by mouth daily. Swallow whole.     benazepril  (LOTENSIN ) 20 MG tablet Take 1 tablet (20 mg total) by mouth daily. 90 tablet 3   Buprenorphine HCl-Naloxone HCl 2-0.5 MG FILM Place 1 tablet under the tongue every other day.  0   methocarbamol  (ROBAXIN ) 750 MG tablet TAKE 1 TABLET BY MOUTH THREE TIMES A DAY AS NEEDED FOR MUSCLE SPASM 60 tablet 3   Multiple Vitamins-Minerals (MULTIVITAMIN PO) Take 1 tablet by mouth daily.     ondansetron  (ZOFRAN -ODT) 4 MG disintegrating tablet Take 4 mg by mouth every 6 (six) hours as needed.     testosterone  cypionate (DEPOTESTOSTERONE CYPIONATE) 200 MG/ML injection Inject 1 mL (200 mg total) into the muscle every 14 (fourteen) days. 6 mL 1   amLODipine  (NORVASC ) 10 MG tablet Take 1 tablet (10 mg total) by mouth daily. 90 tablet 4   atorvastatin  (LIPITOR) 40 MG tablet Take 1 tablet (40 mg total) by mouth daily. 90 tablet 4   fenofibrate  (TRICOR ) 145 MG tablet TAKE 1/2 TABLETS (72.5 MG TOTAL) BY MOUTH DAILY. 90 tablet 3   zolpidem  (AMBIEN ) 10 MG tablet TAKE 1/2 TO 1 TABLET BY MOUTH AT BEDTIME AS NEEDED FOR SLEEP 30 tablet 1  cyanocobalamin  (VITAMIN B12) 1000 MCG tablet Take 1 tablet (1,000 mcg total) by mouth daily.     No facility-administered medications prior to visit.     Per HPI unless specifically indicated in ROS section below Review of Systems  Constitutional:  Negative for activity change, appetite change, chills, fatigue, fever and unexpected weight change.  HENT:  Negative for hearing loss.   Eyes:  Negative for visual disturbance.  Respiratory:  Negative for cough, chest tightness, shortness of breath and wheezing.   Cardiovascular:  Negative for chest pain, palpitations and leg swelling.  Gastrointestinal:  Negative for abdominal  distention, abdominal pain, blood in stool, constipation, diarrhea, nausea and vomiting.  Genitourinary:  Negative for difficulty urinating and hematuria.  Musculoskeletal:  Negative for arthralgias, myalgias and neck pain.  Skin:  Negative for rash.  Neurological:  Negative for dizziness, seizures, syncope and headaches.  Hematological:  Negative for adenopathy. Does not bruise/bleed easily.  Psychiatric/Behavioral:  Negative for dysphoric mood. The patient is not nervous/anxious.     Objective:  BP 128/84 (Cuff Size: Large)   Pulse 61   Temp 97.7 F (36.5 C) (Oral)   Ht 6' 0.84 (1.85 m)   Wt 233 lb 3.2 oz (105.8 kg)   SpO2 96%   BMI 30.91 kg/m   Wt Readings from Last 3 Encounters:  01/22/24 233 lb 3.2 oz (105.8 kg)  06/11/23 235 lb 12.8 oz (107 kg)  03/09/23 239 lb (108.4 kg)      Physical Exam Vitals and nursing note reviewed.  Constitutional:      General: He is not in acute distress.    Appearance: Normal appearance. He is well-developed. He is not ill-appearing.  HENT:     Head: Normocephalic and atraumatic.     Right Ear: Hearing, tympanic membrane, ear canal and external ear normal.     Left Ear: Hearing, tympanic membrane, ear canal and external ear normal.     Mouth/Throat:     Mouth: Mucous membranes are moist.     Pharynx: Oropharynx is clear. No oropharyngeal exudate or posterior oropharyngeal erythema.  Eyes:     General: No scleral icterus.    Extraocular Movements: Extraocular movements intact.     Conjunctiva/sclera: Conjunctivae normal.     Pupils: Pupils are equal, round, and reactive to light.  Neck:     Thyroid : No thyroid  mass or thyromegaly.  Cardiovascular:     Rate and Rhythm: Normal rate and regular rhythm.     Pulses: Normal pulses.          Radial pulses are 2+ on the right side and 2+ on the left side.     Heart sounds: Normal heart sounds. No murmur heard. Pulmonary:     Effort: Pulmonary effort is normal. No respiratory distress.      Breath sounds: Normal breath sounds. No wheezing, rhonchi or rales.  Abdominal:     General: Bowel sounds are normal. There is no distension.     Palpations: Abdomen is soft. There is no mass.     Tenderness: There is no abdominal tenderness. There is no guarding or rebound.     Hernia: No hernia is present.  Musculoskeletal:        General: Normal range of motion.     Cervical back: Normal range of motion and neck supple.     Right lower leg: No edema.     Left lower leg: No edema.  Lymphadenopathy:     Cervical: No cervical adenopathy.  Skin:    General: Skin is warm and dry.     Findings: No rash.  Neurological:     General: No focal deficit present.     Mental Status: He is alert and oriented to person, place, and time.  Psychiatric:        Mood and Affect: Mood normal.        Behavior: Behavior normal.        Thought Content: Thought content normal.        Judgment: Judgment normal.       Results for orders placed or performed in visit on 01/14/24  Testosterone    Collection Time: 01/14/24  9:00 AM  Result Value Ref Range   Testosterone  801.65 300.00 - 890.00 ng/dL  PSA   Collection Time: 01/14/24  9:00 AM  Result Value Ref Range   PSA 1.10 0.10 - 4.00 ng/mL  CBC with Differential/Platelet   Collection Time: 01/14/24  9:00 AM  Result Value Ref Range   WBC 6.1 4.0 - 10.5 K/uL   RBC 4.86 4.22 - 5.81 Mil/uL   Hemoglobin 14.0 13.0 - 17.0 g/dL   HCT 58.4 60.9 - 47.9 %   MCV 85.5 78.0 - 100.0 fl   MCHC 33.7 30.0 - 36.0 g/dL   RDW 86.9 88.4 - 84.4 %   Platelets 356.0 150.0 - 400.0 K/uL   Neutrophils Relative % 46.5 43.0 - 77.0 %   Lymphocytes Relative 36.7 12.0 - 46.0 %   Monocytes Relative 8.4 3.0 - 12.0 %   Eosinophils Relative 7.7 (H) 0.0 - 5.0 %   Basophils Relative 0.7 0.0 - 3.0 %   Neutro Abs 2.8 1.4 - 7.7 K/uL   Lymphs Abs 2.2 0.7 - 4.0 K/uL   Monocytes Absolute 0.5 0.1 - 1.0 K/uL   Eosinophils Absolute 0.5 0.0 - 0.7 K/uL   Basophils Absolute 0.0 0.0 - 0.1  K/uL  Vitamin B12   Collection Time: 01/14/24  9:00 AM  Result Value Ref Range   Vitamin B-12 339 211 - 911 pg/mL  Hemoglobin A1c   Collection Time: 01/14/24  9:00 AM  Result Value Ref Range   Hgb A1c MFr Bld 5.6 4.6 - 6.5 %  Comprehensive metabolic panel with GFR   Collection Time: 01/14/24  9:00 AM  Result Value Ref Range   Sodium 138 135 - 145 mEq/L   Potassium 4.4 3.5 - 5.1 mEq/L   Chloride 103 96 - 112 mEq/L   CO2 26 19 - 32 mEq/L   Glucose, Bld 90 70 - 99 mg/dL   BUN 20 6 - 23 mg/dL   Creatinine, Ser 8.80 0.40 - 1.50 mg/dL   Total Bilirubin 0.5 0.2 - 1.2 mg/dL   Alkaline Phosphatase 38 (L) 39 - 117 U/L   AST 25 0 - 37 U/L   ALT 28 0 - 53 U/L   Total Protein 7.0 6.0 - 8.3 g/dL   Albumin 4.5 3.5 - 5.2 g/dL   GFR 31.05 >39.99 mL/min   Calcium  9.2 8.4 - 10.5 mg/dL  Lipid panel   Collection Time: 01/14/24  9:00 AM  Result Value Ref Range   Cholesterol 165 0 - 200 mg/dL   Triglycerides 873.9 0.0 - 149.0 mg/dL   HDL 51.79 >60.99 mg/dL   VLDL 74.7 0.0 - 59.9 mg/dL   LDL Cholesterol 92 0 - 99 mg/dL   Total CHOL/HDL Ratio 3    NonHDL 117.29     Assessment & Plan:   Problem List Items Addressed  This Visit     Health maintenance examination - Primary (Chronic)   Preventative protocols reviewed and updated unless pt declined. Discussed healthy diet and lifestyle.       HTN (hypertension)   Chronic, stable. Continue current regimen. BP better controlled since cardiology increased benazepril  dose.       Relevant Medications   amLODipine  (NORVASC ) 10 MG tablet   atorvastatin  (LIPITOR) 40 MG tablet   fenofibrate  (TRICOR ) 145 MG tablet   Chronic pain syndrome   This is followed by pain clinic , continues buprenorphine/naloxone      Obesity, Class I, BMI 30-34.9   Encourage healthy diet and lifestyle choices to affect sustainable weight loss       Dyslipidemia   Chronic, on atorva statin and feno fibrate.  LDL goal <70 in high CCS and strong fmhx. The 10-year  ASCVD risk score (Arnett DK, et al., 2019) is: 5.6%   Values used to calculate the score:     Age: 78 years     Clinically relevant sex: Male     Is Non-Hispanic African American: No     Diabetic: No     Tobacco smoker: No     Systolic Blood Pressure: 130 mmHg     Is BP treated: Yes     HDL Cholesterol: 48.2 mg/dL     Total Cholesterol: 165 mg/dL       Relevant Medications   atorvastatin  (LIPITOR) 40 MG tablet   fenofibrate  (TRICOR ) 145 MG tablet   Prediabetes   A1c remains normal range at 5.6%      Secondary male hypogonadism   Chronic, stable period on testosterone  IM replacement q2 wks  He benefits from testosterone  replacement. Again reviewed cardiovascular risk of TRT - he desires to continue this.   RTC 6 mo lab visit only recheck T      Chronic insomnia   Chronic, continue ambien  5mg  nightly.       Relevant Medications   zolpidem  (AMBIEN ) 10 MG tablet   Family history of early CAD   Appreciate cardiology care       Agatston coronary artery calcium  score greater than 400   Appreciate cardiology care - planned yearly f/u.       Relevant Medications   amLODipine  (NORVASC ) 10 MG tablet   atorvastatin  (LIPITOR) 40 MG tablet   fenofibrate  (TRICOR ) 145 MG tablet   Low serum vitamin B12   Not currently on B12 replacement - levels mildly low - suggested starting intermittent b12 replacement.       Other Visit Diagnoses       Immunization due       Relevant Orders   Pneumococcal conjugate vaccine 20-valent (Prevnar 20) (Completed)     Encounter for immunization       Relevant Orders   Flu vaccine trivalent PF, 6mos and older(Flulaval,Afluria,Fluarix,Fluzone) (Completed)        Meds ordered this encounter  Medications   amLODipine  (NORVASC ) 10 MG tablet    Sig: Take 1 tablet (10 mg total) by mouth daily.    Dispense:  90 tablet    Refill:  3   atorvastatin  (LIPITOR) 40 MG tablet    Sig: Take 1 tablet (40 mg total) by mouth daily.    Dispense:  90  tablet    Refill:  3   fenofibrate  (TRICOR ) 145 MG tablet    Sig: Take 0.5 tablets (72.5 mg total) by mouth daily.    Dispense:  45 tablet  Refill:  3   zolpidem  (AMBIEN ) 10 MG tablet    Sig: Take 0.5-1 tablets (5-10 mg total) by mouth at bedtime as needed. for sleep    Dispense:  30 tablet    Refill:  3    Not to exceed 4 additional fills before 01/02/2024    Orders Placed This Encounter  Procedures   Pneumococcal conjugate vaccine 20-valent (Prevnar 20)   Flu vaccine trivalent PF, 6mos and older(Flulaval,Afluria,Fluarix,Fluzone)    Patient Instructions  Flu shot today  Prevnar-20 today.  You are doing well today.  Continue current medicines. Return in 6 months for lab visit only.  Return in 1 year for next physical   Follow up plan: Return in about 1 year (around 01/21/2025) for annual exam, prior fasting for blood work.  Anton Blas, MD

## 2024-01-22 NOTE — Assessment & Plan Note (Addendum)
 Chronic, stable. Continue current regimen. BP better controlled since cardiology increased benazepril  dose.

## 2024-01-22 NOTE — Assessment & Plan Note (Signed)
 Chronic, continue ambien  5mg  nightly.

## 2024-01-22 NOTE — Assessment & Plan Note (Addendum)
 Chronic, stable period on testosterone  IM replacement q2 wks  He benefits from testosterone  replacement. Again reviewed cardiovascular risk of TRT - he desires to continue this.   RTC 6 mo lab visit only recheck T

## 2024-01-22 NOTE — Assessment & Plan Note (Signed)
Appreciate cardiology care.  °

## 2024-01-22 NOTE — Assessment & Plan Note (Addendum)
 This is followed by pain clinic , continues buprenorphine/naloxone

## 2024-01-22 NOTE — Assessment & Plan Note (Signed)
 Appreciate cardiology care - planned yearly f/u.

## 2024-01-22 NOTE — Assessment & Plan Note (Addendum)
 Not currently on B12 replacement - levels mildly low - suggested starting intermittent b12 replacement.

## 2024-01-22 NOTE — Assessment & Plan Note (Signed)
 Encourage healthy diet and lifestyle choices to affect sustainable weight loss

## 2024-01-22 NOTE — Assessment & Plan Note (Signed)
 Chronic, on atorva statin and feno fibrate.  LDL goal <70 in high CCS and strong fmhx. The 10-year ASCVD risk score (Arnett DK, et al., 2019) is: 5.6%   Values used to calculate the score:     Age: 53 years     Clinically relevant sex: Male     Is Non-Hispanic African American: No     Diabetic: No     Tobacco smoker: No     Systolic Blood Pressure: 130 mmHg     Is BP treated: Yes     HDL Cholesterol: 48.2 mg/dL     Total Cholesterol: 165 mg/dL

## 2024-02-04 ENCOUNTER — Telehealth: Payer: Self-pay | Admitting: Gastroenterology

## 2024-02-04 NOTE — Telephone Encounter (Signed)
 Pt was otw to [place]  and had to go to er, had bleeding ulcer, hgb dropped to 7.5 he was scoped and a clip was placed. He got a transfusion and stayed in the hospital for 3 days. His Hgb was 8.6 upon discharge. Hosp wanted him to f/u with gi here in 2 weeks. Pt scheduled to see Dr. Stacia on 02/21/24 at 2:30pm. Pts sister aware of appt.

## 2024-02-04 NOTE — Telephone Encounter (Signed)
 Patient's sister called stating patient is currently on vacation in [place] . States patient had to be admitted to the hospital there due to GI bleed and ulcer. States a endoscopy was preformed. States provider recommended for patient to floww up in tow weeks. Advised sister the soonest would be 2/2. States she does not believe patient should wait that long incase he has another bleed. Sister is requesting a call to discuss further. Please advise, thank you

## 2024-02-05 ENCOUNTER — Telehealth: Payer: Self-pay

## 2024-02-05 NOTE — Transitions of Care (Post Inpatient/ED Visit) (Signed)
 "  02/05/2024  Name: William Friedman MRN: [id number] DOB: [birth year]  Today's TOC FU Call Status: Today's TOC FU Call Status:: Successful TOC FU Call Completed TOC FU Call Complete Date: 02/05/24  Patient's Name and Date of Birth confirmed. Name, DOB  Transition Care Management Follow-up Telephone Call Date of Discharge: 02/04/24 Discharge Facility: Other [facility]) Name of Other (Non[facility]) Discharge Facility: [facility] Type of Discharge: Inpatient Admission Primary Inpatient Discharge Diagnosis:: GI bleed Any questions or concerns?: No  Items Reviewed: Did you receive and understand the discharge instructions provided?: Yes Medications obtained,verified, and reconciled?: Yes (Medications Reviewed) Any new allergies since your discharge?: No Dietary orders reviewed?: Yes Do you have support at home?: Yes People in Home [RPT]: spouse  Medications Reviewed Today: Medications Reviewed Today     Reviewed by Emmitt Pan, LPN (Licensed Practical Nurse) on 02/05/24 at 1114  Med List Status: <None>   Medication Order Taking? Sig Documenting Provider Last Dose Status Informant  amLODipine  (NORVASC ) 10 MG tablet [id number] Yes Take 1 tablet (10 mg total) by mouth daily. Rilla Baller, MD  Active   aspirin  EC 81 MG tablet [id number] Yes Take 1 tablet (81 mg total) by mouth daily. Swallow whole. Rilla Baller, MD  Active   atorvastatin  (LIPITOR) 40 MG tablet [id number] Yes Take 1 tablet (40 mg total) by mouth daily. Rilla Baller, MD  Active   benazepril  (LOTENSIN ) 20 MG tablet [id number] Yes Take 1 tablet (20 mg total) by mouth daily. Nishan, Peter C, MD  Active   Buprenorphine HCl-Naloxone HCl 2-0.5 MG FILM [id number] Yes Place 1 tablet under the tongue every other day. Rilla Baller, MD  Active   fenofibrate  (TRICOR ) 145 MG tablet [id number] Yes Take 0.5 tablets (72.5 mg total) by mouth daily. Rilla Baller, MD  Active   methocarbamol  (ROBAXIN ) 750 MG  tablet [id number] Yes TAKE 1 TABLET BY MOUTH THREE TIMES A DAY AS NEEDED FOR MUSCLE SPASM Rilla Baller, MD  Active   Multiple Vitamins-Minerals (MULTIVITAMIN PO) [id number] Yes Take 1 tablet by mouth daily. [provider]  Active Self  omeprazole (PRILOSEC) 40 MG capsule [id number] Yes Take 40 mg by mouth 2 (two) times daily. [provider]  Active   ondansetron  (ZOFRAN -ODT) 4 MG disintegrating tablet [id number] Yes Take 4 mg by mouth every 6 (six) hours as needed. [provider]  Active   testosterone  cypionate (DEPOTESTOSTERONE CYPIONATE) 200 MG/ML injection [id number] Yes Inject 1 mL (200 mg total) into the muscle every 14 (fourteen) days. Rilla Baller, MD  Active   zolpidem  (AMBIEN ) 10 MG tablet [id number] Yes Take 0.5-1 tablets (5-10 mg total) by mouth at bedtime as needed. for sleep Rilla Baller, MD  Active             Home Care and Equipment/Supplies: Were Home Health Services Ordered?: NA Any new equipment or medical supplies ordered?: NA  Functional Questionnaire: Do you need assistance with bathing/showering or dressing?: No Do you need assistance with meal preparation?: No Do you need assistance with eating?: No Do you have difficulty maintaining continence: No Do you need assistance with getting out of bed/getting out of a chair/moving?: No Do you have difficulty managing or taking your medications?: No  Follow up appointments reviewed: PCP Follow-up appointment confirmed?: Yes Date of PCP follow-up appointment?: 02/12/24 Follow-up Provider: [facility] Follow-up appointment confirmed?: Yes Date of Specialist follow-up appointment?: 02/21/24 Follow-Up Specialty Provider:: GI Do you need transportation to your follow-up appointment?: No Do you understand care options if your  condition(s) worsen?: Yes-patient verbalized understanding    SIGNATURE Julian Lemmings, LPN [facility] Nurse Health Advisor Direct Dial  [phone]  "

## 2024-02-12 ENCOUNTER — Encounter: Payer: Self-pay | Admitting: Family Medicine

## 2024-02-12 ENCOUNTER — Ambulatory Visit: Admitting: Family Medicine

## 2024-02-12 VITALS — BP 128/80 | HR 64 | Temp 97.7°F | Ht 72.84 in | Wt 232.2 lb

## 2024-02-12 DIAGNOSIS — K922 Gastrointestinal hemorrhage, unspecified: Secondary | ICD-10-CM | POA: Insufficient documentation

## 2024-02-12 LAB — CBC WITH DIFFERENTIAL/PLATELET
Basophils Absolute: 0 K/uL (ref 0.0–0.1)
Basophils Relative: 0.6 % (ref 0.0–3.0)
Eosinophils Absolute: 0.3 K/uL (ref 0.0–0.7)
Eosinophils Relative: 6.6 % — ABNORMAL HIGH (ref 0.0–5.0)
HCT: 31.2 % — ABNORMAL LOW (ref 39.0–52.0)
Hemoglobin: 10.3 g/dL — ABNORMAL LOW (ref 13.0–17.0)
Lymphocytes Relative: 35.5 % (ref 12.0–46.0)
Lymphs Abs: 1.4 K/uL (ref 0.7–4.0)
MCHC: 33.1 g/dL (ref 30.0–36.0)
MCV: 86.1 fl (ref 78.0–100.0)
Monocytes Absolute: 0.5 K/uL (ref 0.1–1.0)
Monocytes Relative: 11.5 % (ref 3.0–12.0)
Neutro Abs: 1.9 K/uL (ref 1.4–7.7)
Neutrophils Relative %: 45.8 % (ref 43.0–77.0)
Platelets: 437 K/uL — ABNORMAL HIGH (ref 150.0–400.0)
RBC: 3.62 Mil/uL — ABNORMAL LOW (ref 4.22–5.81)
RDW: 13.4 % (ref 11.5–15.5)
WBC: 4.1 K/uL (ref 4.0–10.5)

## 2024-02-12 LAB — COMPREHENSIVE METABOLIC PANEL WITH GFR
ALT: 23 U/L (ref 3–53)
AST: 21 U/L (ref 5–37)
Albumin: 4.4 g/dL (ref 3.5–5.2)
Alkaline Phosphatase: 33 U/L — ABNORMAL LOW (ref 39–117)
BUN: 21 mg/dL (ref 6–23)
CO2: 26 meq/L (ref 19–32)
Calcium: 9.3 mg/dL (ref 8.4–10.5)
Chloride: 106 meq/L (ref 96–112)
Creatinine, Ser: 1.48 mg/dL (ref 0.40–1.50)
GFR: 53.04 mL/min — ABNORMAL LOW
Glucose, Bld: 77 mg/dL (ref 70–99)
Potassium: 4.1 meq/L (ref 3.5–5.1)
Sodium: 139 meq/L (ref 135–145)
Total Bilirubin: 0.2 mg/dL (ref 0.2–1.2)
Total Protein: 6.9 g/dL (ref 6.0–8.3)

## 2024-02-12 NOTE — Patient Instructions (Signed)
 Labs today  Continue to avoid anti inflammatories Continue omeprazole 40mg  twice daily for next 1-2 months then drop to once daily.  Tylenol  is ok to use as needed Keep GI appointment next week.

## 2024-02-12 NOTE — Progress Notes (Signed)
 " Ph: [phone] Fax: [phone]   Patient ID: William Friedman, male    DOB: [birth year], 54 y.o.   MRN: [id number]  This visit was conducted in person.  BP 128/80 (BP Location: Left Arm, Patient Position: Sitting, Cuff Size: Large)   Pulse 64   Temp 97.7 F (36.5 C) (Oral)   Ht 6' 0.84 (1.85 m)   Wt 232 lb 3.2 oz (105.3 kg)   SpO2 96%   BMI 30.77 kg/m    CC: hospital f/u visit  Subjective:   HPI: William Friedman is a 54 y.o. male presenting on 02/12/2024 for Hospitalization Follow-up (Seen at [place]  [facility] 12/27 for GI Bleed/Pt reports doing much better now//Will FU with gastro 03/02/24/They had him stop aspirin  and start taking Omeprazole 40 mg every morning and evening for 2 months followed by once daily indefinitely./)   Recent hospitalization for upper GI bleed at [facility] in [place] . This presented with dark red bloody bowel movement on 01/30/2024 and malaise, occ lightheadedness.  Hospital records reviewed. Med rec performed.  EGD showed LA grade C reflux esophagitis without overt bleed, with non-bleeding gastric and duodenal erosions and non-bleeding duodenal ulcer s/p clip. Biposies showed chronic gastritis negative for H pylori, no intestinal metaplasia or dysplasia.  Nadir Hgb 7.4, improved to 8.6 upon discharge. Received 1u pRBC.  Treated with IV pantoprazole, discharged on omeprazole 40mg  bid x2 months then daily indefinitely.   He had been taking NSAIDs 800mg  ibuprofen daily for the past year and advil PM at night time.   He saw pain management - suboxone has been increased to 1/2 tab BID (was taking 1/2 once daily)   COLONOSCOPY 04/2022 - [facility])  Home health not needed.  Other follow up appointments scheduled: GI Dr Stacia 02/21/2024 at 2:30pm.  ______________________________________________________________________ Hospital admission: 02/02/2024 Hospital discharge: 02/04/2024 TCM f/u phone call:  performed on 02/05/2024  FOLLOW  UP  Gastroentereology in clinic in 2 weeks [facility] Medicine clinic in 2 weeks   HOSPITAL PROBLEMS:  Active Hospital Problems  Diagnosis POA  Acute GI bleeding Yes  NSAIDs adverse reaction, initial encounter Unknown  Excessive use of nonsteroidal anti-inflammatory drugs (NSAIDs) Unknown  HTN (hypertension) Yes  HLD (hyperlipidemia) Yes  CKD (chronic kidney disease) Yes  DDD (degenerative disc disease), lumbar Yes  Acute blood loss anemia Yes  Hypertriglyceridemia Unknown      Relevant past medical, surgical, family and social history reviewed and updated as indicated. Interim medical history since our last visit reviewed. Allergies and medications reviewed and updated. Outpatient Medications Prior to Visit  Medication Sig Dispense Refill   amLODipine  (NORVASC ) 10 MG tablet Take 1 tablet (10 mg total) by mouth daily. 90 tablet 3   aspirin  EC 81 MG tablet Take 1 tablet (81 mg total) by mouth daily. Swallow whole. (Patient not taking: Reported on 02/12/2024)     atorvastatin  (LIPITOR) 40 MG tablet Take 1 tablet (40 mg total) by mouth daily. 90 tablet 3   benazepril  (LOTENSIN ) 20 MG tablet Take 1 tablet (20 mg total) by mouth daily. 90 tablet 3   Buprenorphine HCl-Naloxone HCl 2-0.5 MG FILM Place 1 tablet under the tongue every other day.  0   fenofibrate  (TRICOR ) 145 MG tablet Take 0.5 tablets (72.5 mg total) by mouth daily. 45 tablet 3   methocarbamol  (ROBAXIN ) 750 MG tablet TAKE 1 TABLET BY MOUTH THREE TIMES A DAY AS NEEDED FOR MUSCLE SPASM 60 tablet 3   Multiple Vitamins-Minerals (MULTIVITAMIN PO) Take 1  tablet by mouth daily.     omeprazole (PRILOSEC) 40 MG capsule Take 40 mg by mouth 2 (two) times daily.     ondansetron  (ZOFRAN -ODT) 4 MG disintegrating tablet Take 4 mg by mouth every 6 (six) hours as needed.     testosterone  cypionate (DEPOTESTOSTERONE CYPIONATE) 200 MG/ML injection Inject 1 mL (200 mg total) into the muscle every 14 (fourteen) days. 6 mL 1   zolpidem  (AMBIEN )  10 MG tablet Take 0.5-1 tablets (5-10 mg total) by mouth at bedtime as needed. for sleep 30 tablet 3   No facility-administered medications prior to visit.     Per HPI unless specifically indicated in ROS section below Review of Systems  Objective:  BP 128/80 (BP Location: Left Arm, Patient Position: Sitting, Cuff Size: Large)   Pulse 64   Temp 97.7 F (36.5 C) (Oral)   Ht 6' 0.84 (1.85 m)   Wt 232 lb 3.2 oz (105.3 kg)   SpO2 96%   BMI 30.77 kg/m   Wt Readings from Last 3 Encounters:  02/12/24 232 lb 3.2 oz (105.3 kg)  01/22/24 233 lb 3.2 oz (105.8 kg)  06/11/23 235 lb 12.8 oz (107 kg)      Physical Exam Vitals and nursing note reviewed.  Constitutional:      Appearance: Normal appearance. He is not ill-appearing.  HENT:     Head: Normocephalic and atraumatic.     Mouth/Throat:     Mouth: Mucous membranes are moist.     Pharynx: Oropharynx is clear. No oropharyngeal exudate or posterior oropharyngeal erythema.  Eyes:     Extraocular Movements: Extraocular movements intact.     Conjunctiva/sclera: Conjunctivae normal.     Pupils: Pupils are equal, round, and reactive to light.  Cardiovascular:     Rate and Rhythm: Normal rate and regular rhythm.     Pulses: Normal pulses.     Heart sounds: Normal heart sounds. No murmur heard. Pulmonary:     Effort: Pulmonary effort is normal. No respiratory distress.     Breath sounds: Normal breath sounds. No wheezing, rhonchi or rales.  Abdominal:     General: Bowel sounds are normal. There is no distension.     Palpations: Abdomen is soft. There is no mass.     Tenderness: There is no abdominal tenderness. There is no guarding or rebound.     Hernia: No hernia is present.  Musculoskeletal:     Cervical back: Normal range of motion and neck supple.     Right lower leg: No edema.     Left lower leg: No edema.  Skin:    General: Skin is warm and dry.     Coloration: Skin is not pale.     Findings: No rash.  Neurological:      Mental Status: He is alert.  Psychiatric:        Mood and Affect: Mood normal.        Behavior: Behavior normal.       Results for orders placed or performed in visit on 01/14/24  Testosterone    Collection Time: 01/14/24  9:00 AM  Result Value Ref Range   Testosterone  801.65 300.00 - 890.00 ng/dL  PSA   Collection Time: 01/14/24  9:00 AM  Result Value Ref Range   PSA 1.10 0.10 - 4.00 ng/mL  CBC with Differential/Platelet   Collection Time: 01/14/24  9:00 AM  Result Value Ref Range   WBC 6.1 4.0 - 10.5 K/uL   RBC 4.86 4.22 -  5.81 Mil/uL   Hemoglobin 14.0 13.0 - 17.0 g/dL   HCT 58.4 60.9 - 47.9 %   MCV 85.5 78.0 - 100.0 fl   MCHC 33.7 30.0 - 36.0 g/dL   RDW 86.9 88.4 - 84.4 %   Platelets 356.0 150.0 - 400.0 K/uL   Neutrophils Relative % 46.5 43.0 - 77.0 %   Lymphocytes Relative 36.7 12.0 - 46.0 %   Monocytes Relative 8.4 3.0 - 12.0 %   Eosinophils Relative 7.7 (H) 0.0 - 5.0 %   Basophils Relative 0.7 0.0 - 3.0 %   Neutro Abs 2.8 1.4 - 7.7 K/uL   Lymphs Abs 2.2 0.7 - 4.0 K/uL   Monocytes Absolute 0.5 0.1 - 1.0 K/uL   Eosinophils Absolute 0.5 0.0 - 0.7 K/uL   Basophils Absolute 0.0 0.0 - 0.1 K/uL  Vitamin B12   Collection Time: 01/14/24  9:00 AM  Result Value Ref Range   Vitamin B-12 339 211 - 911 pg/mL  Hemoglobin A1c   Collection Time: 01/14/24  9:00 AM  Result Value Ref Range   Hgb A1c MFr Bld 5.6 4.6 - 6.5 %  Comprehensive metabolic panel with GFR   Collection Time: 01/14/24  9:00 AM  Result Value Ref Range   Sodium 138 135 - 145 mEq/L   Potassium 4.4 3.5 - 5.1 mEq/L   Chloride 103 96 - 112 mEq/L   CO2 26 19 - 32 mEq/L   Glucose, Bld 90 70 - 99 mg/dL   BUN 20 6 - 23 mg/dL   Creatinine, Ser 8.80 0.40 - 1.50 mg/dL   Total Bilirubin 0.5 0.2 - 1.2 mg/dL   Alkaline Phosphatase 38 (L) 39 - 117 U/L   AST 25 0 - 37 U/L   ALT 28 0 - 53 U/L   Total Protein 7.0 6.0 - 8.3 g/dL   Albumin 4.5 3.5 - 5.2 g/dL   GFR 31.05 >39.99 mL/min   Calcium  9.2 8.4 - 10.5 mg/dL   Lipid panel   Collection Time: 01/14/24  9:00 AM  Result Value Ref Range   Cholesterol 165 0 - 200 mg/dL   Triglycerides 873.9 0.0 - 149.0 mg/dL   HDL 51.79 >60.99 mg/dL   VLDL 74.7 0.0 - 59.9 mg/dL   LDL Cholesterol 92 0 - 99 mg/dL   Total CHOL/HDL Ratio 3    NonHDL 117.29     Assessment & Plan:   Problem List Items Addressed This Visit     Upper GI bleed - Primary   Hospitalized at [facility] 01/2024 s/p EGD showing duodenal ulcer s/p clip.  Attributed to NSAID induced bleed.  Continue daily PPI indefinitely.  Nadir Hgb 7.4.  Reviewed avoiding NSAIDs in the future.  Update CBC, CMP today.       Relevant Orders   CBC with Differential/Platelet   Comprehensive metabolic panel with GFR     No orders of the defined types were placed in this encounter.   Orders Placed This Encounter  Procedures   CBC with Differential/Platelet   Comprehensive metabolic panel with GFR    Patient Instructions  Labs today  Continue to avoid anti inflammatories Continue omeprazole 40mg  twice daily for next 1-2 months then drop to once daily.  Tylenol  is ok to use as needed Keep GI appointment next week.   Follow up plan: No follow-ups on file.  Anton Blas, MD   "

## 2024-02-12 NOTE — Assessment & Plan Note (Addendum)
 Hospitalized at [facility] 01/2024 s/p EGD showing duodenal ulcer s/p clip.  Attributed to NSAID induced bleed.  Continue daily PPI indefinitely.  Nadir Hgb 7.4.  Reviewed avoiding NSAIDs in the future.  Update CBC, CMP today.

## 2024-02-13 ENCOUNTER — Ambulatory Visit: Payer: Self-pay | Admitting: Family Medicine

## 2024-02-21 ENCOUNTER — Encounter: Payer: Self-pay | Admitting: Gastroenterology

## 2024-02-21 ENCOUNTER — Ambulatory Visit: Admitting: Gastroenterology

## 2024-02-21 VITALS — BP 150/80 | HR 92 | Ht 72.84 in | Wt 235.4 lb

## 2024-02-21 DIAGNOSIS — K2100 Gastro-esophageal reflux disease with esophagitis, without bleeding: Secondary | ICD-10-CM

## 2024-02-21 DIAGNOSIS — K279 Peptic ulcer, site unspecified, unspecified as acute or chronic, without hemorrhage or perforation: Secondary | ICD-10-CM

## 2024-02-21 DIAGNOSIS — D62 Acute posthemorrhagic anemia: Secondary | ICD-10-CM

## 2024-02-21 NOTE — Patient Instructions (Signed)
 Continue omeprazole twice daily for 2 months, then once daily.   You have been scheduled for an endoscopy. Please follow written instructions given to you at your visit today.  If you use inhalers (even only as needed), please bring them with you on the day of your procedure.  If you take any of the following medications, they will need to be adjusted prior to your procedure:   DO NOT TAKE 7 DAYS PRIOR TO TEST- Trulicity (dulaglutide) Ozempic, Wegovy (semaglutide) Mounjaro, Zepbound (tirzepatide) Bydureon Bcise (exanatide extended release)  DO NOT TAKE 1 DAY PRIOR TO YOUR TEST Rybelsus (semaglutide) Adlyxin (lixisenatide) Victoza (liraglutide) Byetta (exanatide) _______________________________________________________________  _______________________________________________________  If your blood pressure at your visit was 140/90 or greater, please contact your primary care physician to follow up on this.  _______________________________________________________  If you are age 56 or older, your body mass index should be between 23-30. Your Body mass index is 31.19 kg/m. If this is out of the aforementioned range listed, please consider follow up with your Primary Care Provider.  If you are age 56 or younger, your body mass index should be between 19-25. Your Body mass index is 31.19 kg/m. If this is out of the aformentioned range listed, please consider follow up with your Primary Care Provider.   ________________________________________________________  The [facility] providers would like to encourage you to use MYCHART to communicate with providers for non-urgent requests or questions.  Due to long hold times on the telephone, sending your provider a message by [facility] may be a faster and more efficient way to get a response.  Please allow 48 business hours for a response.  Please remember that this is for non-urgent requests.   _______________________________________________________  [facility] is using a team-based approach to care.  Your team is made up of your doctor and two to three APPS. Our APPS (Nurse Practitioners and Physician Assistants) work with your physician to ensure care continuity for you. They are fully qualified to address your health concerns and develop a treatment plan. They communicate directly with your gastroenterologist to care for you. Seeing the Advanced Practice Practitioners on your physician's team can help you by facilitating care more promptly, often allowing for earlier appointments, access to diagnostic testing, procedures, and other specialty referrals.

## 2024-02-21 NOTE — Progress Notes (Unsigned)
 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)   ELBOW SURGERY  02/06/2002   R   KNEE CARTILAGE SURGERY Left 2022   SHOULDER SURGERY  02/07/1991   R   Family History  Problem Relation Age of Onset   Hypertension Mother    Cancer Mother        breast   Hypertension Father    Diabetes Father    Stroke Father 29       ministroke   Coronary artery disease Father 53       CABG   Cancer Sister        breast   Heart disease Maternal Grandmother    Coronary artery disease Maternal Grandfather 30   Cancer Cousin 11       Ewing sarcoma   Colon cancer Neg Hx    Colon polyps Neg Hx    Esophageal cancer Neg Hx    Rectal cancer Neg Hx    Stomach cancer Neg Hx    Social History[1] Current Outpatient Medications  Medication Sig Dispense Refill   amLODipine  (NORVASC ) 10 MG tablet Take 1 tablet (10 mg total) by mouth daily. 90 tablet 3   aspirin  EC 81 MG tablet Take 1 tablet (81 mg total) by mouth daily. Swallow whole.     atorvastatin  (LIPITOR) 40 MG tablet Take 1 tablet (40 mg total) by mouth daily. 90 tablet 3   benazepril  (LOTENSIN ) 20 MG tablet Take 1 tablet (20 mg total) by mouth daily. 90 tablet 3   Buprenorphine HCl-Naloxone HCl 2-0.5 MG FILM Place 1 tablet under the tongue every other day.  0   fenofibrate  (TRICOR ) 145 MG tablet Take 0.5 tablets (72.5 mg total) by mouth daily. 45 tablet 3   LUTEIN PO Take 1 tablet  by mouth daily.     methocarbamol  (ROBAXIN ) 750 MG tablet TAKE 1 TABLET BY MOUTH THREE TIMES A DAY AS NEEDED FOR MUSCLE SPASM 60 tablet 3   Multiple Vitamins-Minerals (MULTIVITAMIN PO) Take 1 tablet by mouth daily.     omeprazole (PRILOSEC) 40 MG capsule Take 40 mg by mouth 2 (two) times daily.     ondansetron  (ZOFRAN -ODT) 4 MG disintegrating tablet Take 4 mg by mouth every 6 (six) hours as needed.     testosterone  cypionate (DEPOTESTOSTERONE CYPIONATE) 200 MG/ML injection Inject 1 mL (200 mg total) into the muscle every 14 (fourteen) days. 6 mL 1   zolpidem  (AMBIEN ) 10 MG tablet Take 0.5-1 tablets (5-10 mg total) by mouth at bedtime as needed. for sleep 30 tablet 3   No current facility-administered medications for this visit.   Allergies[2]   Review of Systems: All systems reviewed and negative except where noted in HPI.    No results found.  Physical Exam: BP ROLLEN)  150/80   Pulse 92   Ht 6' 0.84 (1.85 m)   Wt 235 lb 6 oz (106.8 kg)   BMI 31.19 kg/m  Constitutional: Pleasant,well-developed, ***male in no acute distress. HEENT: Normocephalic and atraumatic. Conjunctivae are normal. No scleral icterus. Neck supple.  Cardiovascular: Normal rate, regular rhythm.  Pulmonary/chest: Effort normal and breath sounds normal. No wheezing, rales or rhonchi. Abdominal: Soft, nondistended, nontender. Bowel sounds active throughout. There are no masses palpable. No hepatomegaly. Extremities: no edema Lymphadenopathy: No cervical adenopathy noted. Neurological: Alert and oriented to person place and time. Skin: Skin is warm and dry. No rashes noted. Psychiatric: Normal mood and affect. Behavior is normal.  CBC    Component Value Date/Time   WBC 4.1 02/12/2024 1007   RBC 3.62 (L) 02/12/2024 1007   HGB 10.3 (L) 02/12/2024 1007   HGB 13.3 05/01/2014 1221   HCT 31.2 (L) 02/12/2024 1007   HCT 40.3 05/01/2014 1221   PLT 437.0 (H) 02/12/2024 1007   PLT 326 05/01/2014 1221   MCV 86.1  02/12/2024 1007   MCV 86 05/01/2014 1221   MCH 28.3 05/01/2014 1221   MCH 28.9 04/30/2014 2306   MCHC 33.1 02/12/2024 1007   RDW 13.4 02/12/2024 1007   RDW 14.0 05/01/2014 1221   LYMPHSABS 1.4 02/12/2024 1007   MONOABS 0.5 02/12/2024 1007   EOSABS 0.3 02/12/2024 1007   BASOSABS 0.0 02/12/2024 1007    CMP     Component Value Date/Time   NA 139 02/12/2024 1007   NA 141 05/01/2014 1221   K 4.1 02/12/2024 1007   K 4.4 05/01/2014 1221   CL 106 02/12/2024 1007   CL 106 05/01/2014 1221   CO2 26 02/12/2024 1007   CO2 23 05/01/2014 1221   GLUCOSE 77 02/12/2024 1007   GLUCOSE 114 (H) 05/01/2014 1221   BUN 21 02/12/2024 1007   BUN 13 05/01/2014 1221   CREATININE 1.48 02/12/2024 1007   CREATININE 0.97 05/01/2014 1221   CALCIUM  9.3 02/12/2024 1007   CALCIUM  9.5 05/01/2014 1221   PROT 6.9 02/12/2024 1007   PROT 8.2 (H) 05/01/2014 1221   ALBUMIN 4.4 02/12/2024 1007   ALBUMIN 4.9 05/01/2014 1221   AST 21 02/12/2024 1007   AST 25 05/01/2014 1221   ALT 23 02/12/2024 1007   ALT 28 05/01/2014 1221   ALKPHOS 33 (L) 02/12/2024 1007   ALKPHOS 84 05/01/2014 1221   BILITOT 0.2 02/12/2024 1007   BILITOT 0.3 05/01/2014 1221   GFRNONAA >60 05/01/2014 1221   GFRAA >60 05/01/2014 1221       Latest Ref Rng & Units 02/12/2024   10:07 AM 01/14/2024    9:00 AM 07/17/2023    8:26 AM  CBC EXTENDED  WBC 4.0 - 10.5 K/uL 4.1  6.1  5.7   RBC 4.22 - 5.81 Mil/uL 3.62  4.86  4.71   Hemoglobin 13.0 - 17.0 g/dL 89.6  85.9  86.4   HCT 39.0 - 52.0 % 31.2  41.5  39.9   Platelets 150.0 - 400.0 K/uL 437.0  356.0  371.0   NEUT# 1.4 - 7.7 K/uL 1.9  2.8  3.0   Lymph# 0.7 - 4.0 K/uL 1.4  2.2  1.9       ASSESSMENT AND PLAN:  Rilla Baller, MD     [1]  Social History Tobacco Use   Smoking status: Never   Smokeless tobacco: Never  Substance Use Topics   Alcohol use: No    Comment: rarely   Drug use:  Never  [2] No Known Allergies  "

## 2024-03-06 ENCOUNTER — Encounter: Payer: Self-pay | Admitting: Gastroenterology

## 2024-03-07 ENCOUNTER — Other Ambulatory Visit: Payer: Self-pay | Admitting: Family Medicine

## 2024-03-07 DIAGNOSIS — G894 Chronic pain syndrome: Secondary | ICD-10-CM

## 2024-03-10 NOTE — Telephone Encounter (Signed)
 Requesting: Robaxin  Contract: Yes UDS:  Last Visit: 02/12/2024 Next Visit: Visit date not found Last Refill: 11/15/23 #60 3 rf   Please Advise

## 2024-03-13 ENCOUNTER — Encounter: Payer: Self-pay | Admitting: Gastroenterology

## 2024-03-13 ENCOUNTER — Ambulatory Visit: Admitting: Gastroenterology

## 2024-03-13 VITALS — BP 116/65 | HR 66 | Temp 97.9°F | Resp 15 | Ht 73.0 in | Wt 235.0 lb

## 2024-03-13 DIAGNOSIS — K279 Peptic ulcer, site unspecified, unspecified as acute or chronic, without hemorrhage or perforation: Secondary | ICD-10-CM

## 2024-03-13 MED ORDER — SODIUM CHLORIDE 0.9 % IV SOLN: 500.0000 mL | INTRAVENOUS | Status: DC

## 2024-03-13 NOTE — Op Note (Signed)
 [facility] Patient Name: William Friedman Procedure Date: 03/13/2024 1:57 PM MRN: [id number] Endoscopist: Glendia E. Stacia , MD, [id number] Age: 54 Referring MD:  Date of Birth: December 19, 1970 Gender: Male Account #: [id number] Procedure:                Upper GI endoscopy Indications:              Follow-up of acute peptic ulcer with hemorrhage Medicines:                Monitored Anesthesia Care Procedure:                Pre-Anesthesia Assessment:                           - Prior to the procedure, a History and Physical                            was performed, and patient medications and                            allergies were reviewed. The patient's tolerance of                            previous anesthesia was also reviewed. The risks                            and benefits of the procedure and the sedation                            options and risks were discussed with the patient.                            All questions were answered, and informed consent                            was obtained. Prior Anticoagulants: The patient has                            taken no anticoagulant or antiplatelet agents. ASA                            Grade Assessment: II - A patient with mild systemic                            disease. After reviewing the risks and benefits,                            the patient was deemed in satisfactory condition to                            undergo the procedure.                           After obtaining informed consent, the endoscope was  passed under direct vision. Throughout the                            procedure, the patient's blood pressure, pulse, and                            oxygen saturations were monitored continuously. The                            Olympus Scope SN [id number] was introduced through the                            mouth, and advanced to the second part of duodenum.                             The upper GI endoscopy was accomplished without                            difficulty. The patient tolerated the procedure                            well. Scope In: Scope Out: Findings:                 The examined portions of the nasopharynx,                            oropharynx and larynx were normal.                           Diffuse mild mucosal changes characterized by                            longitudinal markings and altered texture were                            found in the upper third of the esophagus, in the                            middle third of the esophagus and in the lower                            third of the esophagus. Biopsies were obtained from                            the proximal and distal esophagus with cold forceps                            for histology of suspected eosinophilic                            esophagitis. Estimated blood loss was minimal.                           The exam  of the esophagus was otherwise normal.                           A 4 cm hiatal hernia was present.                           A healed ulcer was found in the prepyloric region                            of the stomach. The scar tissue was healthy in                            appearance. Biopsies were taken with a cold forceps                            for Helicobacter pylori testing. Estimated blood                            loss was minimal.                           A single 3 mm sessile polyp was found in the                            gastric body. The polyp was removed with a cold                            biopsy forceps. Resection and retrieval were                            complete. Estimated blood loss was minimal.                           The exam of the stomach was otherwise normal.                           The examined duodenum was normal. Complications:            No immediate complications. Estimated Blood Loss:     Estimated blood loss was  minimal. Impression:               - The examined portions of the nasopharynx,                            oropharynx and larynx were normal.                           - Longitudinally marked, texture changed mucosa in                            the esophagus, suspicious for eosinophilic                            esophagitis                           -  4 cm hiatal hernia.                           - Scar in the prepyloric region of the stomach.                            Biopsied.                           - Gastric polyp, resected. This was consistent with                            a fundic gland polyp.                           - Normal examined duodenum.                           - Biopsies were taken with a cold forceps for                            evaluation of eosinophilic esophagitis.                           - The previously noted ulcers in the stomach and                            duodenum have healed.                           - The previously noted reflux esophagitis has                            healed. Recommendation:           - Patient has a contact number available for                            emergencies. The signs and symptoms of potential                            delayed complications were discussed with the                            patient. Return to normal activities tomorrow.                            Written discharge instructions were provided to the                            patient.                           - Resume previous diet.                           - Continue present medications. Okay to decrease  omeprazole to once daily. Would continue this                            indefinitely given history of bleeding peptic ulcer                            disease and reflux esophagitis.                           - Await pathology results.                           - Continue to avoid NSAIDs indefinitely. Bracken Moffa E.  Stacia, MD 03/13/2024 2:48:46 PM This report has been signed electronically.

## 2024-03-13 NOTE — Patient Instructions (Addendum)
-   Resume previous diet. - Continue present medications. Okay to decrease     omeprazole to once daily. Would continue this     indefinitely given history of bleeding peptic ulcer     disease and reflux esophagitis. - Await pathology results. - Continue to avoid NSAIDs indefinitely.   YOU HAD AN ENDOSCOPIC PROCEDURE TODAY AT THE [facility]:   Refer to the procedure report that was given to you for any specific questions about what was found during the examination.  If the procedure report does not answer your questions, please call your gastroenterologist to clarify.  If you requested that your care partner not be given the details of your procedure findings, then the procedure report has been included in a sealed envelope for you to review at your convenience later.  YOU SHOULD EXPECT: Some feelings of bloating in the abdomen. Passage of more gas than usual.  Walking can help get rid of the air that was put into your GI tract during the procedure and reduce the bloating. If you had a lower endoscopy (such as a colonoscopy or flexible sigmoidoscopy) you may notice spotting of blood in your stool or on the toilet paper. If you underwent a bowel prep for your procedure, you may not have a normal bowel movement for a few days.  Please Note:  You might notice some irritation and congestion in your nose or some drainage.  This is from the oxygen used during your procedure.  There is no need for concern and it should clear up in a day or so.  SYMPTOMS TO REPORT IMMEDIATELY:  Following upper endoscopy (EGD)  Vomiting of blood or coffee ground material  New chest pain or pain under the shoulder blades  Painful or persistently difficult swallowing  New shortness of breath  Fever of 100F or higher  Black, tarry-looking stools  For urgent or emergent issues, a gastroenterologist can be reached at any hour by calling [phone]. Do not use MyChart messaging for urgent concerns.     DIET:  We do recommend a small meal at first, but then you may proceed to your regular diet.  Drink plenty of fluids but you should avoid alcoholic beverages for 24 hours.  ACTIVITY:  You should plan to take it easy for the rest of today and you should NOT DRIVE or use heavy machinery until tomorrow (because of the sedation medicines used during the test).    FOLLOW UP: Our staff will call the number listed on your records the next business day following your procedure.  We will call around 7:15- 8:00 am to check on you and address any questions or concerns that you may have regarding the information given to you following your procedure. If we do not reach you, we will leave a message.     If any biopsies were taken you will be contacted by phone or by letter within the next 1-3 weeks.  Please call us  at [phone] if you have not heard about the biopsies in 3 weeks.    SIGNATURES/CONFIDENTIALITY: You and/or your care partner have signed paperwork which will be entered into your electronic medical record.  These signatures attest to the fact that that the information above on your After Visit Summary has been reviewed and is understood.  Full responsibility of the confidentiality of this discharge information lies with you and/or your care-partner.

## 2024-03-13 NOTE — Progress Notes (Signed)
 Called to room to assist during endoscopic procedure.  Patient ID and intended procedure confirmed with present staff. Received instructions for my participation in the procedure from the performing physician.

## 2024-03-13 NOTE — Progress Notes (Signed)
 History and Physical Interval Note:  03/13/2024 2:06 PM  William Friedman  has presented today for endoscopic procedure(s), with the diagnosis of  Encounter Diagnosis  Name Primary?   PUD (peptic ulcer disease) Yes  .  The various methods of evaluation and treatment have been discussed with the patient and/or family. After consideration of risks, benefits and other options for treatment, the patient has consented to  the endoscopic procedure(s).   The patient's history has been reviewed, patient examined, no change in status, stable for endoscopic procedure(s).  I have reviewed the patient's chart and labs.  Questions were answered to the patient's satisfaction.     Narciso Stoutenburg E. Stacia, MD [facility]

## 2024-03-13 NOTE — Progress Notes (Signed)
 Vss nad trans to pacu

## 2024-03-13 NOTE — Progress Notes (Signed)
 Pt's states no medical or surgical changes since previsit or office visit.

## 2024-03-14 ENCOUNTER — Encounter: Payer: Self-pay | Admitting: Family Medicine

## 2024-03-14 ENCOUNTER — Telehealth: Payer: Self-pay

## 2024-03-14 NOTE — Telephone Encounter (Signed)
 LMOM

## 2024-07-22 ENCOUNTER — Other Ambulatory Visit

## 2025-01-19 ENCOUNTER — Other Ambulatory Visit

## 2025-01-26 ENCOUNTER — Encounter: Admitting: Family Medicine
